# Patient Record
Sex: Female | Born: 1965 | Race: White | Hispanic: No | Marital: Single | Smoking: Never smoker
Health system: Southern US, Community
[De-identification: ages and names within clinical notes are randomized; demographics above are authoritative.]

## PROBLEM LIST (undated history)

## (undated) DIAGNOSIS — R5383 Other fatigue: Secondary | ICD-10-CM

## (undated) DIAGNOSIS — F32A Depression, unspecified: Secondary | ICD-10-CM

## (undated) DIAGNOSIS — F319 Bipolar disorder, unspecified: Secondary | ICD-10-CM

## (undated) DIAGNOSIS — K219 Gastro-esophageal reflux disease without esophagitis: Secondary | ICD-10-CM

## (undated) DIAGNOSIS — R519 Headache, unspecified: Secondary | ICD-10-CM

## (undated) DIAGNOSIS — I10 Essential (primary) hypertension: Secondary | ICD-10-CM

## (undated) DIAGNOSIS — Z808 Family history of malignant neoplasm of other organs or systems: Secondary | ICD-10-CM

## (undated) DIAGNOSIS — E039 Hypothyroidism, unspecified: Secondary | ICD-10-CM

## (undated) DIAGNOSIS — Z800 Family history of malignant neoplasm of digestive organs: Secondary | ICD-10-CM

## (undated) DIAGNOSIS — Z803 Family history of malignant neoplasm of breast: Secondary | ICD-10-CM

## (undated) DIAGNOSIS — C801 Malignant (primary) neoplasm, unspecified: Secondary | ICD-10-CM

## (undated) DIAGNOSIS — K589 Irritable bowel syndrome without diarrhea: Secondary | ICD-10-CM

## (undated) HISTORY — DX: Headache, unspecified: R51.9

## (undated) HISTORY — PX: OVARY SURGERY: SHX727

## (undated) HISTORY — PX: UPPER GI ENDOSCOPY: SHX6162

## (undated) HISTORY — DX: Family history of malignant neoplasm of digestive organs: Z80.0

## (undated) HISTORY — PX: COLONOSCOPY: SHX174

## (undated) HISTORY — DX: Bipolar disorder, unspecified: F31.9

## (undated) HISTORY — PX: CHOLECYSTECTOMY: SHX55

## (undated) HISTORY — DX: Irritable bowel syndrome, unspecified: K58.9

## (undated) HISTORY — DX: Other fatigue: R53.83

## (undated) HISTORY — DX: Hypothyroidism, unspecified: E03.9

## (undated) HISTORY — DX: Family history of malignant neoplasm of breast: Z80.3

## (undated) HISTORY — DX: Hypercalcemia: E83.52

## (undated) HISTORY — PX: REPLACEMENT TOTAL KNEE: SUR1224

## (undated) HISTORY — DX: Family history of malignant neoplasm of other organs or systems: Z80.8

---
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
## 2000-11-12 ENCOUNTER — Other Ambulatory Visit: Admission: RE | Admit: 2000-11-12 | Discharge: 2000-11-12 | Payer: Self-pay | Admitting: Family Medicine

## 2001-06-13 ENCOUNTER — Ambulatory Visit (HOSPITAL_COMMUNITY): Admission: RE | Admit: 2001-06-13 | Discharge: 2001-06-13 | Payer: Self-pay | Admitting: Obstetrics & Gynecology

## 2001-12-31 ENCOUNTER — Other Ambulatory Visit: Admission: RE | Admit: 2001-12-31 | Discharge: 2001-12-31 | Payer: Self-pay | Admitting: Family Medicine

## 2003-04-22 ENCOUNTER — Other Ambulatory Visit: Admission: RE | Admit: 2003-04-22 | Discharge: 2003-04-22 | Payer: Self-pay | Admitting: Family Medicine

## 2004-05-04 ENCOUNTER — Other Ambulatory Visit: Admission: RE | Admit: 2004-05-04 | Discharge: 2004-05-04 | Payer: Self-pay | Admitting: Family Medicine

## 2005-05-14 ENCOUNTER — Other Ambulatory Visit: Admission: RE | Admit: 2005-05-14 | Discharge: 2005-05-14 | Payer: Self-pay | Admitting: Family Medicine

## 2006-05-14 ENCOUNTER — Ambulatory Visit (HOSPITAL_COMMUNITY): Admission: RE | Admit: 2006-05-14 | Discharge: 2006-05-14 | Payer: Self-pay | Admitting: Family Medicine

## 2006-05-29 ENCOUNTER — Other Ambulatory Visit: Admission: RE | Admit: 2006-05-29 | Discharge: 2006-05-29 | Payer: Self-pay | Admitting: Family Medicine

## 2006-06-14 ENCOUNTER — Ambulatory Visit (HOSPITAL_COMMUNITY): Admission: RE | Admit: 2006-06-14 | Discharge: 2006-06-14 | Payer: Self-pay | Admitting: Gastroenterology

## 2006-06-17 ENCOUNTER — Ambulatory Visit (HOSPITAL_COMMUNITY): Admission: RE | Admit: 2006-06-17 | Discharge: 2006-06-17 | Payer: Self-pay | Admitting: Gastroenterology

## 2006-07-23 ENCOUNTER — Encounter (INDEPENDENT_AMBULATORY_CARE_PROVIDER_SITE_OTHER): Payer: Self-pay | Admitting: Specialist

## 2006-07-23 ENCOUNTER — Ambulatory Visit (HOSPITAL_COMMUNITY): Admission: RE | Admit: 2006-07-23 | Discharge: 2006-07-24 | Payer: Self-pay | Admitting: General Surgery

## 2007-12-01 ENCOUNTER — Other Ambulatory Visit: Admission: RE | Admit: 2007-12-01 | Discharge: 2007-12-01 | Payer: Self-pay | Admitting: Family Medicine

## 2008-12-01 ENCOUNTER — Other Ambulatory Visit: Admission: RE | Admit: 2008-12-01 | Discharge: 2008-12-01 | Payer: Self-pay | Admitting: Family Medicine

## 2010-07-25 NOTE — Op Note (Signed)
Anita Joyce, Anita NO.:  [id number]   MEDICAL RECORD NO.:  [id number]          PATIENT TYPE:  AMB   LOCATION:  DAY                          FACILITY:  [facility]   PHYSICIAN:  Adolph Pollack, M.D.DATE OF BIRTH:  06/26/1965   DATE OF PROCEDURE:  07/23/2006  DATE OF DISCHARGE:                               OPERATIVE REPORT   PREOPERATIVE DIAGNOSES:  Biliary dyskinesia.   POSTOPERATIVE DIAGNOSIS:  Biliary dyskinesia.   PROCEDURE:  Laparoscopic cholecystectomy and intraoperative  cholangiogram.   SURGEON:  Adolph Pollack, M.D.   ASSISTANT:  Ollen Gross. Vernell Morgans, M.D.   ANESTHESIA:  General.   INDICATION:  [name] is a 45 year old female who has been having  problems with right upper quadrant and epigastric pain with nausea and  vomiting.  It started around the Christmas of 2007.  It began became  worse and worse.  She had upper endoscopy which is unrevealing.  An  abdominal ultrasound showed a normal gallbladder.  Nuclear medicine  hepatobiliary scan demonstrates a dysfunctional gallbladder.  She has  tried proton pump inhibitors but still has symptoms.  She now presents  for elective laparoscopic cholecystectomy.   TECHNIQUE:  She was seen in the holding room and brought to the  operating room, placed supine on the operating table and general  anesthetic was administered.  The abdominal wall was sterilely prepped  and draped.  In the supraumbilical region local anesthetic was  infiltrated and an incision was made in the supraumbilical region  through the skin, subcutaneous tissue, fascia and peritoneum.  The  pursestring suture of 0-0 Vicryl placed around the fascial edges.  A  Hassan trocar was introduced into the peritoneal cavity and  pneumoperitoneum was then created by insufflation of CO2 gas.   She was then placed in reverse Trendelenburg position the right side  tilted up.  A laparoscope was introduced under direct vision an 11 mm  trocar  was placed an epigastric incision and two 5 mm trocars were  placed in the right mid abdomen.  The fundus of the gallbladder was  grasped.  Filmy adhesions between the omentum and duodenum, the  gallbladder, body the gallbladder were noted and these were carefully  taken down bluntly.  I then mobilized infundibulum staying with  dissection on the gallbladder.  I was able to isolate the cystic duct  and create a window around it.  A clip was then placed at the cystic  duct gallbladder junction.  A small incision was made in the cystic  duct.  A cholangiocatheter was passed through the anterior lateral  abdominal wall placed in the cystic duct and cholangiogram was  performed.   Under real time firm fluoroscopy dilute contrast material was injected  to the cystic duct which was of short length.  The common hepatic, right  and left hepatic, common bile ducts all filled and contrast drained  promptly into the duodenum.  There was no obvious evidence of  obstruction.  Final reports pending radiologist interpretation.   The cholangiocatheter was removed, the cystic duct was clipped three  times proximally and divided.  I identified the anterior posterior  branch of the cystic artery and created windows around these.  They were  clipped and divided.  I dissected the gallbladder free from liver using  electrocautery, placed in an Endopouch bag.  Some bile did spill out of  the bag.   I copiously irrigated gallbladder fossa and controlled bleeding with  electrocautery.  I then removed the gallbladder in the Endopouch bag  through the supraumbilical port.  I closed the fascial defect in the  supraumbilical region by tightening up and tying down the pursestring  suture under laparoscopic vision.  I then copiously irrigated out the  perihepatic area and noted fluid still appeared to be bilious.  I  subsequently cut the suture of the supraumbilical fascial closure and  reinserted Hasson on trocar.   I placed a 10 mm camera through the that  port and irrigated out the perihepatic area and inspected the  gallbladder fossa.  The clips on the cystic duct were solid.  There is  no evidence of bile leak from the gallbladder fossa.  I figured this was  just some of the spillage of bile from the bag.  The fluid cleared up  and was evacuated.   We then removed the trocars except for the epigastric trocar.  I then  closed supra-umbilical fascial defect with interrupted 0-0 Vicryl  sutures.  I reinsufflated the abdomen, reinspected the perihepatic area  and no bilious drainage or her leak was noted.  Fascial closure was  solid.  I then removed the trocar and released pneumoperitoneum.  The  skin incisions were closed with 4-0 Monocryl subcuticular stitches.  Steri-Strips and sterile dressings were applied.   She tolerated the procedure without any apparent complications and was  taken to recovery in satisfactory condition.      Adolph Pollack, M.D.  Electronically Signed     TJR/MEDQ  D:  07/23/2006  T:  07/23/2006  Job:  [id number]   cc:   Fayrene Fearing L. Malon Kindle., M.D.  Fax: [phone]   Dario Guardian, M.D.  Fax: [phone]

## 2010-07-28 NOTE — Op Note (Signed)
[facility]  Patient:    Anita Joyce, Anita Joyce [facility] Visit Number: [id number] MRN: [id number]          Service Type: DSU Location: [facility] Attending Physician:  Minette Headland Dictated by:   Freddy Finner, M.D. Proc. Date: 06/13/01 Admit Date:  06/13/2001                             Operative Report  PREOPERATIVE DIAGNOSIS:       Chronic right pelvic pain.  POSTOPERATIVE DIAGNOSIS:      Chronic right pelvic pain with no evidence of                               pelvic pathology except for slight irregular                               enlargement with mottled appearance of uterus                               (consistent with findings noted in 1993).  No                               evidence of pelvic or abdominal adhesions, no                               evidence of pelvic endometriosis.  OPERATION:                    1. Laparoscopy.                               2. Repeat uterosacral nerve ablation with                                  bipolar coagulation.  INTRAOPERATIVE COMPLICATIONS:  None.  SURGEON:                      Freddy Finner, M.D.  ESTIMATED BLOOD LOSS:         10 cc.  INDICATIONS:                  The patient has had chronic right pelvic and lower abdominal pain for a number of months.  She has been evaluated by gastroenterologist with negative findings, with normal CT scan of the abdomen and pelvis.  There was some suggestion that the uterus could be pressing on the bowel.  To her examination here in my office, there was slight enlargement of the uterus, but this was consistent with old findings and there was no significant change since 1993.  Based on the persistence of her symptoms and the negative CT and GI workup, she is now admitted for laparoscopy.  FINDINGS:                     As noted in the postoperative diagnosis.  DESCRIPTION OF PROCEDURE:     The patient was admitted on the morning of surgery, brought to the  operating room.  After placing adequate general endotracheal anesthesia, placed in the dorsal lithotomy position using the Allen stirrups.  Betadine prep of the abdomen, perineum, and vagina was carried out.  The bladder was evacuated with a Robinson catheter and sterile technique.  Hulka tenaculum was attached to the cervix, sterile drapes were applied.  Two small incisions were made, one through an old scar at the umbilicus and one just above the symphysis.  An 11 mm disposable trocar was introduced through the upper incision, and inspection revealed adequate placement within the peritoneal cavity with no evidence of injury on entry. Pneumoperitoneum was allowed to accumulate with carbon dioxide gas.  A second 5 mm trocar was placed in the lower incision.  A blunt probe was used for manipulation of pelvic contents during the procedure.  Systematic examination was carried out with findings as noted above.  The bipolar forceps were used through the operating channel of the laparoscope, and the uterosacrals were cauterized at their junction with the cervix without difficulty.  The procedure was terminated at this point, gas allowed to escape.  Instruments were removed.  The skin incisions were closed with interrupted subcuticular sutures of 3-0 Dexon.  Marcaine 0.25% plain was injected into the incision sites.  The patient was given 30 mg of Toradol IV.  She was taken to recovery in good condition, will be discharged with routine outpatient surgical instructions for follow-up in the office. Dictated by:   Freddy Finner, M.D. Attending Physician:  Minette Headland DD:  06/13/01 TD:  06/13/01 Job: [id number]

## 2010-07-28 NOTE — Op Note (Signed)
NAMEJANYAH, Anita Joyce             ACCOUNT NO.:  [id number]   MEDICAL RECORD NO.:  [id number]          PATIENT TYPE:  AMB   LOCATION:  ENDO                         FACILITY:  [facility]   PHYSICIAN:  James L. Malon Kindle., M.D.DATE OF BIRTH:  1965/08/10   DATE OF PROCEDURE:  06/14/2006  DATE OF DISCHARGE:                               OPERATIVE REPORT   PROCEDURE:  Esophagogastroduodenoscopy.   MEDICATIONS:  Cetacaine spray, Fentanyl 75 mcg, Versed 10 mg IV.   INDICATIONS:  Vague upper abdominal pain, nausea and vomiting, negative  gallbladder ultrasound.   DESCRIPTION OF PROCEDURE:  The procedure was explained to the patient  and consent was obtained. With the patient in the left lateral decubitus  position, the Pentax upper endoscope was inserted blindly into the  esophagus, advanced under direct visualization.  The stomach was  entered, pylorus identified and passed, the duodenum including the bulb  and second portion were seen well and were unremarkable.  The scope was  withdrawn back into the stomach.  The pyloric channel, antrum and body  were normal.  Fundus and cardia, sigmoid on retroflexed view were  normal.  Additionally, proximal esophagus was endoscopically normal.  The scope was withdrawn and the patient tolerated the procedure well.   ASSESSMENT:  Nausea, vomiting and abdominal pain with negative  endoscopy.   PLAN:  Will proceed with CCK stimulated hepatobiliary scan.  Will set up  that today.  Come back to the office in 3 weeks.           ______________________________  Llana Aliment Malon Kindle., M.D.     Waldron Session  D:  06/14/2006  T:  06/14/2006  Job:  [id number]

## 2012-02-18 ENCOUNTER — Other Ambulatory Visit: Payer: Self-pay | Admitting: Family Medicine

## 2012-02-18 ENCOUNTER — Other Ambulatory Visit (HOSPITAL_COMMUNITY)
Admission: RE | Admit: 2012-02-18 | Discharge: 2012-02-18 | Disposition: A | Discharge status: Home / Self Care | Payer: BC Managed Care – PPO | Source: Ambulatory Visit | Attending: Family Medicine | Admitting: Family Medicine

## 2012-02-18 DIAGNOSIS — Z0000 Encounter for general adult medical examination without abnormal findings: Secondary | ICD-10-CM | POA: Insufficient documentation

## 2013-05-19 ENCOUNTER — Other Ambulatory Visit: Payer: Self-pay | Admitting: Family Medicine

## 2013-05-19 ENCOUNTER — Ambulatory Visit
Admission: RE | Admit: 2013-05-19 | Discharge: 2013-05-19 | Disposition: A | Discharge status: Home / Self Care | Payer: BC Managed Care – PPO | Source: Ambulatory Visit | Attending: Family Medicine | Admitting: Family Medicine

## 2013-05-19 DIAGNOSIS — R109 Unspecified abdominal pain: Secondary | ICD-10-CM

## 2013-05-19 IMAGING — CT CT ABD-PELV W/ CM
2 of 5 series · 17 of 46 positions shown, 19 images · IV contrast (READICAT/WATER & [ID] OMNI 300)
Comparison: none

[Series 2: abd/pelvis with · axial · 0.84mm/px · z∈[-462,-42]mm · 14 of 93 slices shown, 16 images]
[im 6/93  soft-tissue]
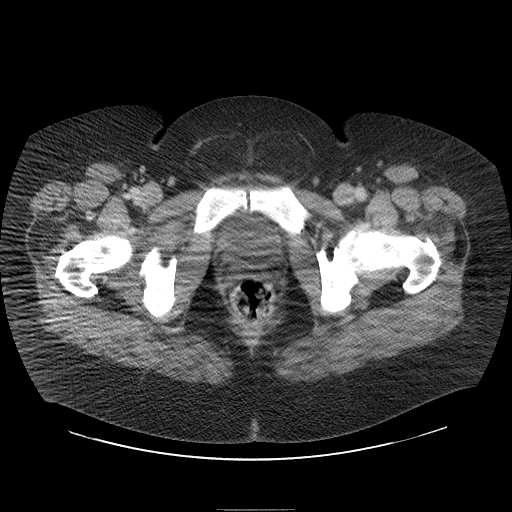
[im 6/93  bone]
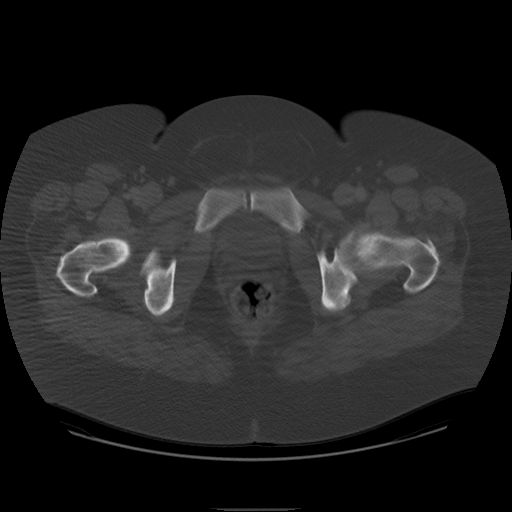
[im 11/93  soft-tissue]
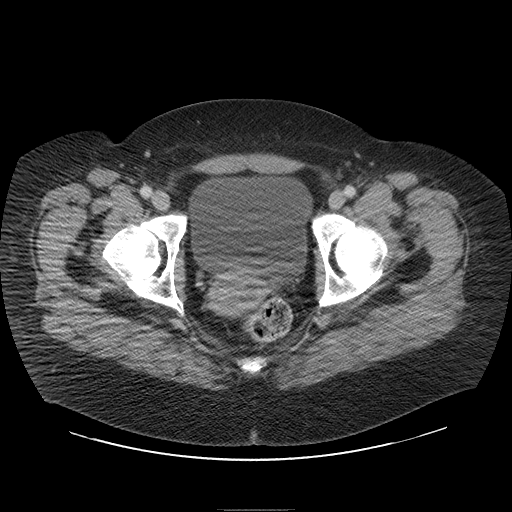
[im 17/93  soft-tissue]
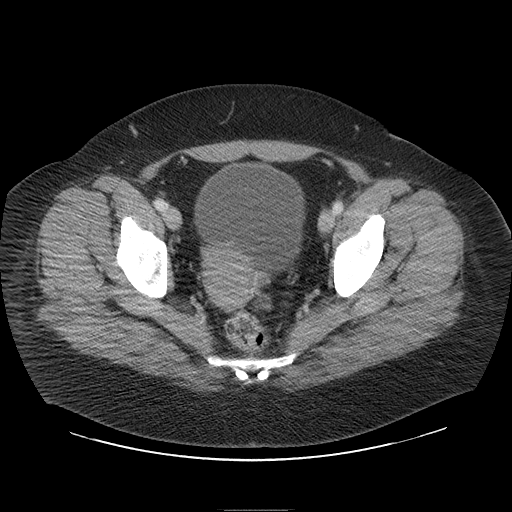
[im 28/93  soft-tissue]
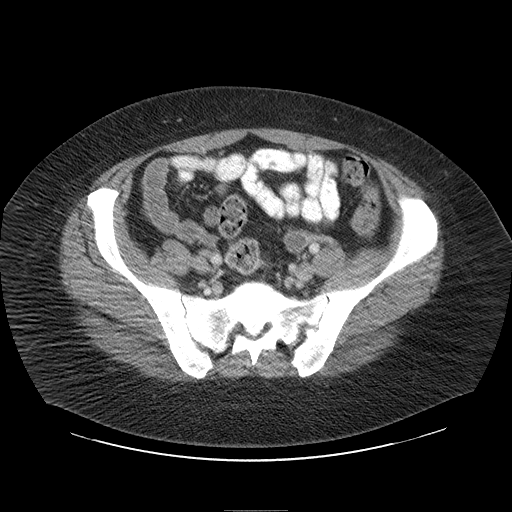
[im 33/93  soft-tissue]
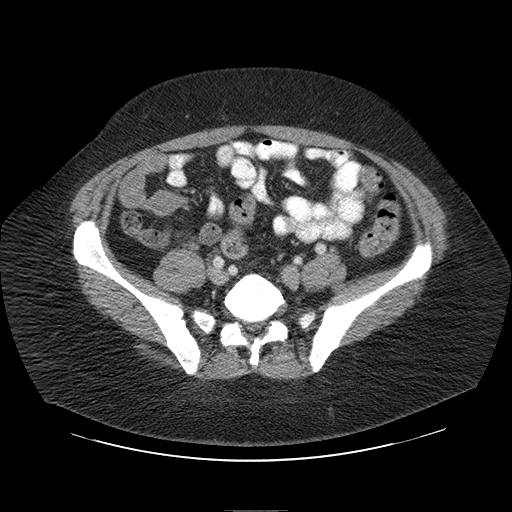
[im 38/93  soft-tissue]
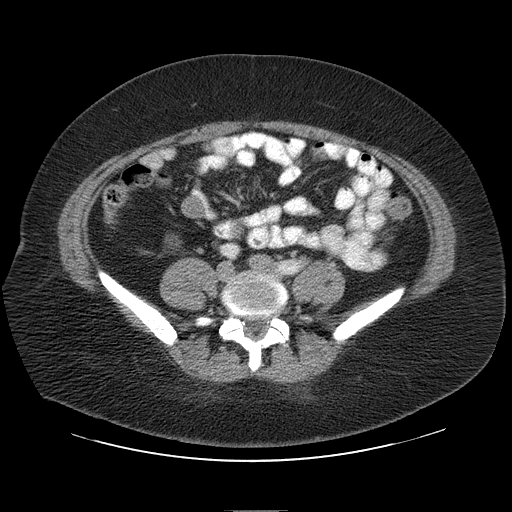
[im 44/93  soft-tissue]
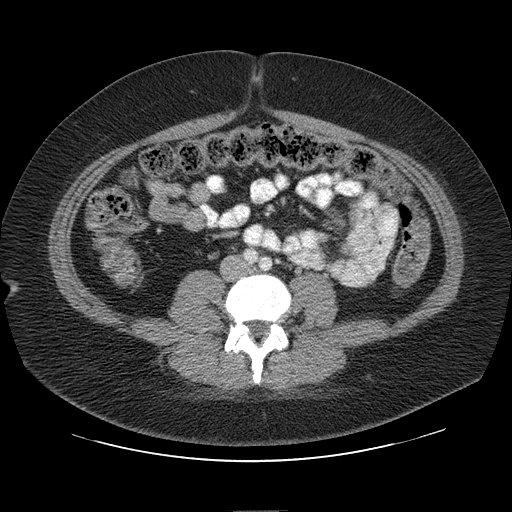
[im 49/93  soft-tissue]
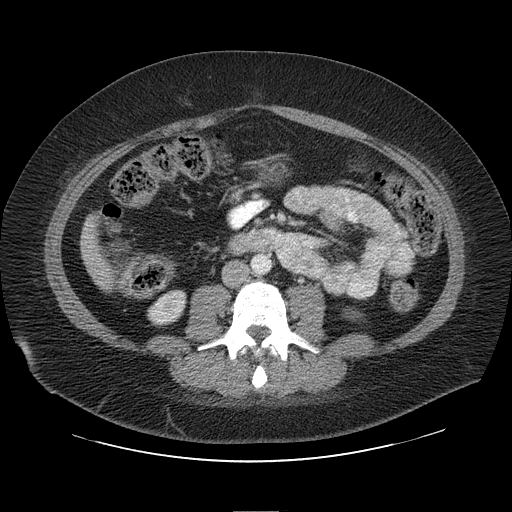
[im 55/93  soft-tissue]
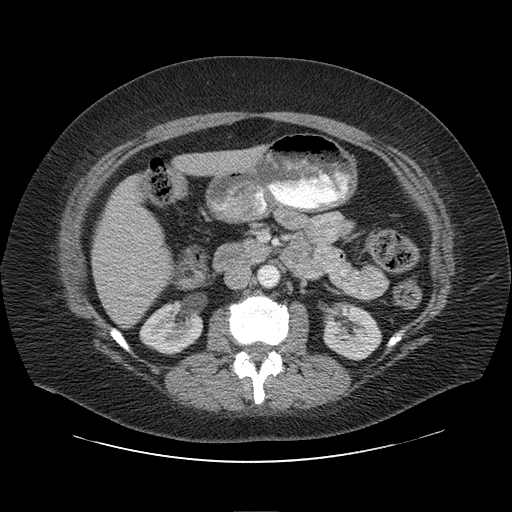
[im 55/93  bone]
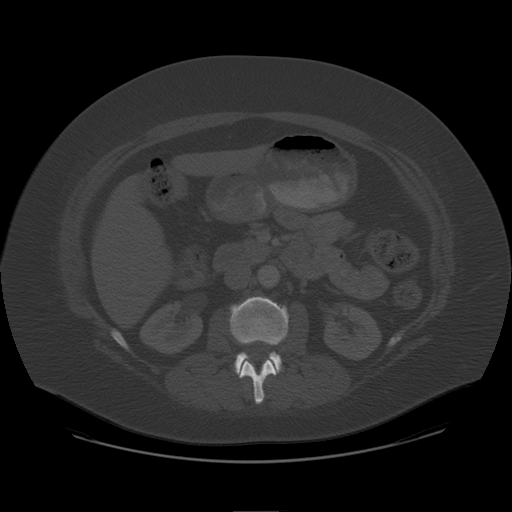
[im 60/93  soft-tissue]
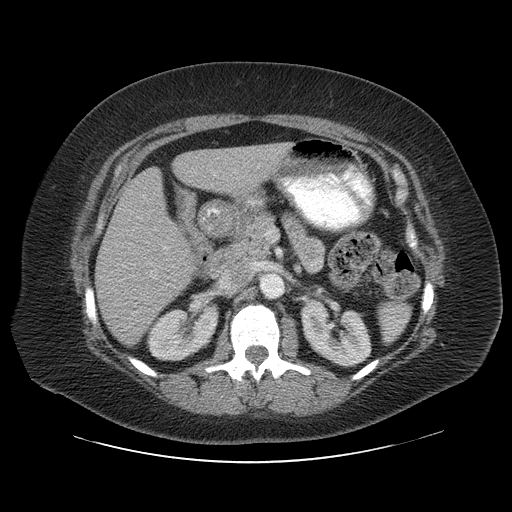
[im 71/93  soft-tissue]
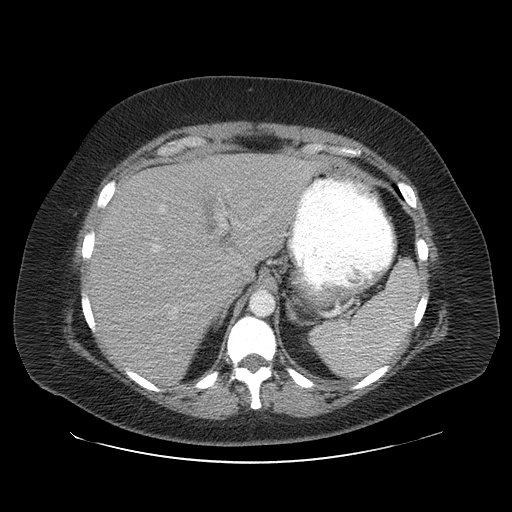
[im 76/93  soft-tissue]
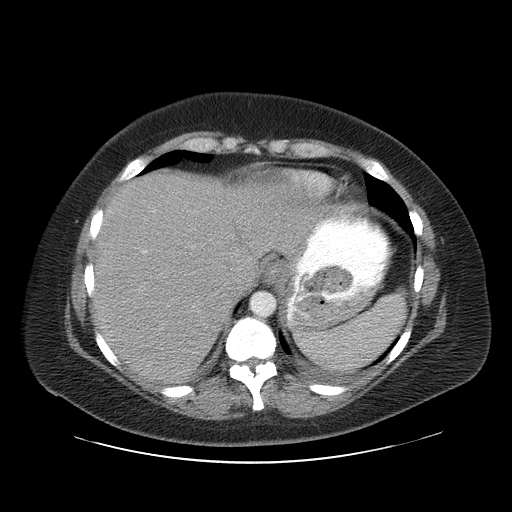
[im 82/93  soft-tissue]
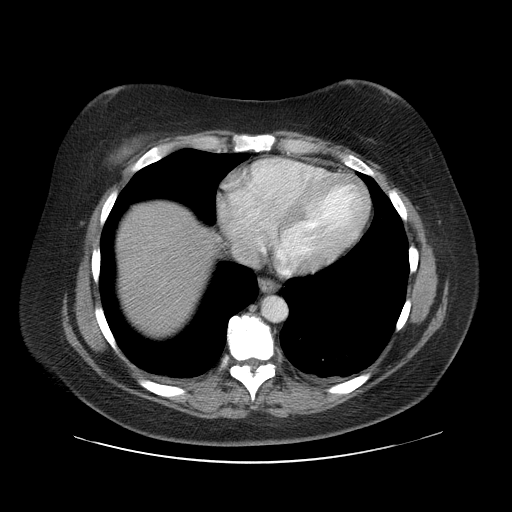
[im 87/93  soft-tissue]
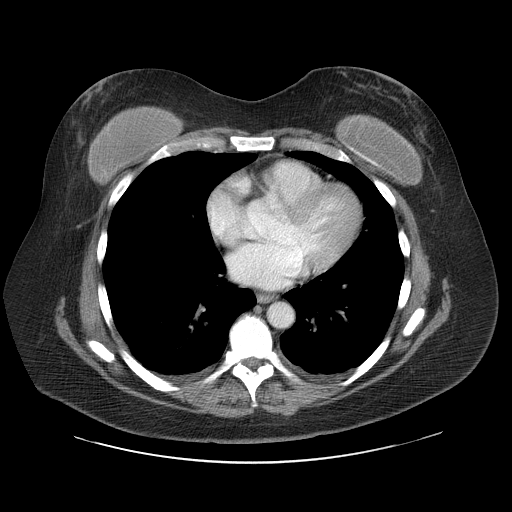

[Series 400: cor · coronal · 1.04mm/px · 3 of 137 slices shown]
[im 46/137  soft-tissue]
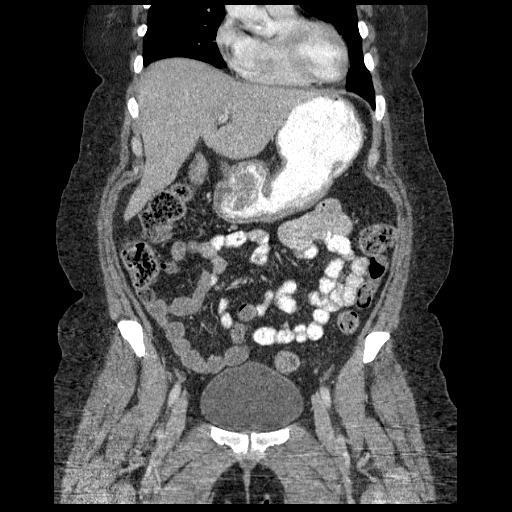
[im 61/137  soft-tissue]
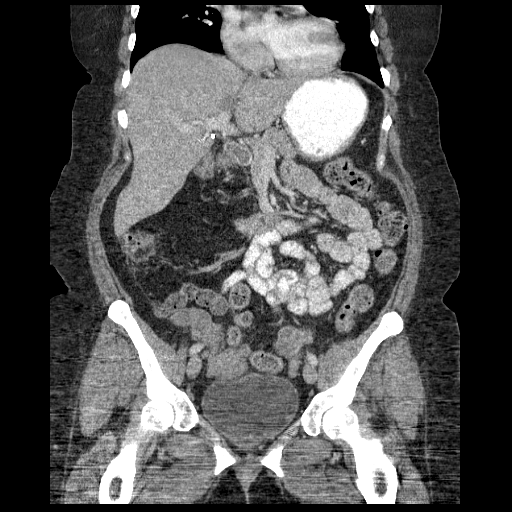
[im 76/137  soft-tissue]
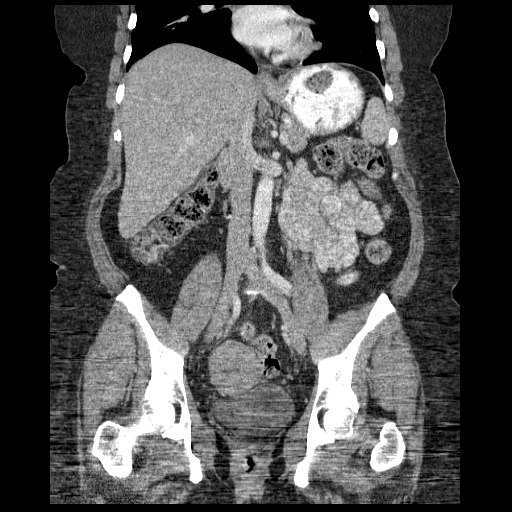

[17 of 46 positions shown; findings below may reference images not displayed]

CLINICAL DATA
New onset right abdomen pain

EXAM
CT ABDOMEN AND PELVIS WITH CONTRAST

TECHNIQUE
Multidetector CT imaging of the abdomen and pelvis was performed
using the standard protocol following bolus administration of
intravenous contrast.

CONTRAST
125 mL Omnipaque 300

COMPARISON
None.

FINDINGS
There is mild diffuse fatty infiltration liver. The patient is
status post prior cholecystectomy. The spleen, pancreas, adrenal
glands and kidneys are normal. There is right parapelvic renal cyst.
There is no hydronephrosis bilaterally. The aorta is normal. There
is no abdominal lymphadenopathy. There is no small bowel obstruction
or diverticulitis. Bowel content is noted throughout colon. The
appendix is normal.

Fluid-filled bladder is normal. The uterus is normal. The bilateral
ovaries are normal. There is minimal dependent atelectasis of the
posterior lung bases. Mild degenerative joint changes of the spine
are noted.

IMPRESSION
Normal appendix. No acute abnormality identified in the abdomen and
pelvis. Constipation.

SIGNATURE

## 2013-05-19 MED ORDER — IOHEXOL 300 MG/ML  SOLN
125.0000 mL | Freq: Once | INTRAMUSCULAR | Status: AC | PRN
Start: 2013-05-19 — End: 2013-05-19

## 2015-03-22 ENCOUNTER — Encounter: Payer: Self-pay | Admitting: Gastroenterology

## 2015-04-04 ENCOUNTER — Other Ambulatory Visit (HOSPITAL_COMMUNITY)
Admission: RE | Admit: 2015-04-04 | Discharge: 2015-04-04 | Disposition: A | Discharge status: Home / Self Care | Payer: Self-pay | Source: Ambulatory Visit | Attending: Family Medicine | Admitting: Family Medicine

## 2015-04-04 ENCOUNTER — Other Ambulatory Visit: Payer: Self-pay | Admitting: Family Medicine

## 2015-04-04 DIAGNOSIS — Z01419 Encounter for gynecological examination (general) (routine) without abnormal findings: Secondary | ICD-10-CM | POA: Insufficient documentation

## 2015-04-07 LAB — CYTOLOGY - PAP

## 2015-07-11 DIAGNOSIS — R197 Diarrhea, unspecified: Secondary | ICD-10-CM | POA: Diagnosis not present

## 2015-07-11 DIAGNOSIS — Z800 Family history of malignant neoplasm of digestive organs: Secondary | ICD-10-CM | POA: Diagnosis not present

## 2015-07-25 ENCOUNTER — Other Ambulatory Visit: Payer: Self-pay | Admitting: Gastroenterology

## 2015-07-25 DIAGNOSIS — R197 Diarrhea, unspecified: Secondary | ICD-10-CM | POA: Diagnosis not present

## 2015-07-25 DIAGNOSIS — K529 Noninfective gastroenteritis and colitis, unspecified: Secondary | ICD-10-CM | POA: Diagnosis not present

## 2015-07-25 DIAGNOSIS — Z800 Family history of malignant neoplasm of digestive organs: Secondary | ICD-10-CM | POA: Diagnosis not present

## 2015-11-07 DIAGNOSIS — E039 Hypothyroidism, unspecified: Secondary | ICD-10-CM | POA: Diagnosis not present

## 2016-01-13 DIAGNOSIS — L650 Telogen effluvium: Secondary | ICD-10-CM | POA: Diagnosis not present

## 2016-01-16 DIAGNOSIS — L659 Nonscarring hair loss, unspecified: Secondary | ICD-10-CM | POA: Diagnosis not present

## 2016-01-16 DIAGNOSIS — I10 Essential (primary) hypertension: Secondary | ICD-10-CM | POA: Diagnosis not present

## 2016-01-16 DIAGNOSIS — E039 Hypothyroidism, unspecified: Secondary | ICD-10-CM | POA: Diagnosis not present

## 2016-04-12 DIAGNOSIS — L650 Telogen effluvium: Secondary | ICD-10-CM | POA: Diagnosis not present

## 2016-04-12 DIAGNOSIS — L219 Seborrheic dermatitis, unspecified: Secondary | ICD-10-CM | POA: Diagnosis not present

## 2016-07-02 DIAGNOSIS — J209 Acute bronchitis, unspecified: Secondary | ICD-10-CM | POA: Diagnosis not present

## 2016-10-19 DIAGNOSIS — R1032 Left lower quadrant pain: Secondary | ICD-10-CM | POA: Diagnosis not present

## 2016-10-19 DIAGNOSIS — R300 Dysuria: Secondary | ICD-10-CM | POA: Diagnosis not present

## 2016-10-29 DIAGNOSIS — R1032 Left lower quadrant pain: Secondary | ICD-10-CM | POA: Diagnosis not present

## 2016-10-29 DIAGNOSIS — E039 Hypothyroidism, unspecified: Secondary | ICD-10-CM | POA: Diagnosis not present

## 2016-10-29 DIAGNOSIS — I10 Essential (primary) hypertension: Secondary | ICD-10-CM | POA: Diagnosis not present

## 2016-10-29 DIAGNOSIS — F3174 Bipolar disorder, in full remission, most recent episode manic: Secondary | ICD-10-CM | POA: Diagnosis not present

## 2016-11-09 DIAGNOSIS — R1030 Lower abdominal pain, unspecified: Secondary | ICD-10-CM | POA: Diagnosis not present

## 2016-11-09 DIAGNOSIS — N949 Unspecified condition associated with female genital organs and menstrual cycle: Secondary | ICD-10-CM | POA: Diagnosis not present

## 2016-11-09 DIAGNOSIS — N760 Acute vaginitis: Secondary | ICD-10-CM | POA: Diagnosis not present

## 2016-11-21 ENCOUNTER — Other Ambulatory Visit: Payer: Self-pay | Admitting: Physician Assistant

## 2016-11-21 DIAGNOSIS — R1032 Left lower quadrant pain: Secondary | ICD-10-CM

## 2016-11-22 ENCOUNTER — Ambulatory Visit
Admission: RE | Admit: 2016-11-22 | Discharge: 2016-11-22 | Disposition: A | Discharge status: Home / Self Care | Payer: BLUE CROSS/BLUE SHIELD | Source: Ambulatory Visit | Attending: Physician Assistant | Admitting: Physician Assistant

## 2016-11-22 DIAGNOSIS — R1032 Left lower quadrant pain: Secondary | ICD-10-CM

## 2016-11-22 DIAGNOSIS — K5732 Diverticulitis of large intestine without perforation or abscess without bleeding: Secondary | ICD-10-CM | POA: Diagnosis not present

## 2016-11-22 MED ORDER — IOPAMIDOL (ISOVUE-300) INJECTION 61%
125.0000 mL | Freq: Once | INTRAVENOUS | Status: AC | PRN
  Administered 2016-11-22: 125 mL via INTRAVENOUS

## 2016-11-23 ENCOUNTER — Other Ambulatory Visit: Payer: BLUE CROSS/BLUE SHIELD

## 2016-11-26 DIAGNOSIS — N3020 Other chronic cystitis without hematuria: Secondary | ICD-10-CM | POA: Diagnosis not present

## 2016-11-26 DIAGNOSIS — R350 Frequency of micturition: Secondary | ICD-10-CM | POA: Diagnosis not present

## 2016-11-26 DIAGNOSIS — R351 Nocturia: Secondary | ICD-10-CM | POA: Diagnosis not present

## 2016-12-10 DIAGNOSIS — R102 Pelvic and perineal pain: Secondary | ICD-10-CM | POA: Diagnosis not present

## 2016-12-14 DIAGNOSIS — R1032 Left lower quadrant pain: Secondary | ICD-10-CM | POA: Diagnosis not present

## 2016-12-14 DIAGNOSIS — K580 Irritable bowel syndrome with diarrhea: Secondary | ICD-10-CM | POA: Diagnosis not present

## 2017-01-07 DIAGNOSIS — E039 Hypothyroidism, unspecified: Secondary | ICD-10-CM | POA: Diagnosis not present

## 2017-01-14 DIAGNOSIS — L659 Nonscarring hair loss, unspecified: Secondary | ICD-10-CM | POA: Diagnosis not present

## 2017-01-14 DIAGNOSIS — E039 Hypothyroidism, unspecified: Secondary | ICD-10-CM | POA: Diagnosis not present

## 2017-01-14 DIAGNOSIS — J069 Acute upper respiratory infection, unspecified: Secondary | ICD-10-CM | POA: Diagnosis not present

## 2017-01-14 DIAGNOSIS — I10 Essential (primary) hypertension: Secondary | ICD-10-CM | POA: Diagnosis not present

## 2017-01-18 DIAGNOSIS — K580 Irritable bowel syndrome with diarrhea: Secondary | ICD-10-CM | POA: Diagnosis not present

## 2017-01-18 DIAGNOSIS — R1032 Left lower quadrant pain: Secondary | ICD-10-CM | POA: Diagnosis not present

## 2017-02-15 DIAGNOSIS — Z1231 Encounter for screening mammogram for malignant neoplasm of breast: Secondary | ICD-10-CM | POA: Diagnosis not present

## 2017-02-15 DIAGNOSIS — Z803 Family history of malignant neoplasm of breast: Secondary | ICD-10-CM | POA: Diagnosis not present

## 2017-04-26 DIAGNOSIS — K580 Irritable bowel syndrome with diarrhea: Secondary | ICD-10-CM | POA: Diagnosis not present

## 2017-04-26 DIAGNOSIS — R1032 Left lower quadrant pain: Secondary | ICD-10-CM | POA: Diagnosis not present

## 2017-05-29 DIAGNOSIS — M25562 Pain in left knee: Secondary | ICD-10-CM | POA: Diagnosis not present

## 2017-05-29 DIAGNOSIS — M25462 Effusion, left knee: Secondary | ICD-10-CM | POA: Diagnosis not present

## 2017-09-16 DIAGNOSIS — R300 Dysuria: Secondary | ICD-10-CM | POA: Diagnosis not present

## 2017-09-16 DIAGNOSIS — R1032 Left lower quadrant pain: Secondary | ICD-10-CM | POA: Diagnosis not present

## 2017-09-16 DIAGNOSIS — N390 Urinary tract infection, site not specified: Secondary | ICD-10-CM | POA: Diagnosis not present

## 2017-09-27 DIAGNOSIS — G43A0 Cyclical vomiting, not intractable: Secondary | ICD-10-CM | POA: Diagnosis not present

## 2017-09-27 DIAGNOSIS — K580 Irritable bowel syndrome with diarrhea: Secondary | ICD-10-CM | POA: Diagnosis not present

## 2017-09-27 DIAGNOSIS — R1030 Lower abdominal pain, unspecified: Secondary | ICD-10-CM | POA: Diagnosis not present

## 2017-09-27 DIAGNOSIS — R1084 Generalized abdominal pain: Secondary | ICD-10-CM | POA: Diagnosis not present

## 2017-10-25 DIAGNOSIS — R309 Painful micturition, unspecified: Secondary | ICD-10-CM | POA: Diagnosis not present

## 2017-10-25 DIAGNOSIS — M545 Low back pain: Secondary | ICD-10-CM | POA: Diagnosis not present

## 2017-10-25 DIAGNOSIS — R102 Pelvic and perineal pain: Secondary | ICD-10-CM | POA: Diagnosis not present

## 2017-10-30 DIAGNOSIS — K580 Irritable bowel syndrome with diarrhea: Secondary | ICD-10-CM | POA: Diagnosis not present

## 2017-11-12 DIAGNOSIS — E039 Hypothyroidism, unspecified: Secondary | ICD-10-CM | POA: Diagnosis not present

## 2018-01-13 DIAGNOSIS — E039 Hypothyroidism, unspecified: Secondary | ICD-10-CM | POA: Diagnosis not present

## 2018-01-13 DIAGNOSIS — I10 Essential (primary) hypertension: Secondary | ICD-10-CM | POA: Diagnosis not present

## 2018-01-13 DIAGNOSIS — L659 Nonscarring hair loss, unspecified: Secondary | ICD-10-CM | POA: Diagnosis not present

## 2018-01-13 DIAGNOSIS — E049 Nontoxic goiter, unspecified: Secondary | ICD-10-CM | POA: Diagnosis not present

## 2018-01-14 ENCOUNTER — Other Ambulatory Visit: Payer: Self-pay | Admitting: Endocrinology

## 2018-01-14 DIAGNOSIS — E010 Iodine-deficiency related diffuse (endemic) goiter: Secondary | ICD-10-CM

## 2018-01-17 ENCOUNTER — Ambulatory Visit
Admission: RE | Admit: 2018-01-17 | Discharge: 2018-01-17 | Disposition: A | Discharge status: Home / Self Care | Payer: BLUE CROSS/BLUE SHIELD | Source: Ambulatory Visit | Attending: Endocrinology | Admitting: Endocrinology

## 2018-01-17 DIAGNOSIS — E041 Nontoxic single thyroid nodule: Secondary | ICD-10-CM | POA: Diagnosis not present

## 2018-01-17 DIAGNOSIS — E010 Iodine-deficiency related diffuse (endemic) goiter: Secondary | ICD-10-CM

## 2018-01-27 DIAGNOSIS — F3174 Bipolar disorder, in full remission, most recent episode manic: Secondary | ICD-10-CM | POA: Diagnosis not present

## 2018-01-27 DIAGNOSIS — I10 Essential (primary) hypertension: Secondary | ICD-10-CM | POA: Diagnosis not present

## 2018-01-27 DIAGNOSIS — E039 Hypothyroidism, unspecified: Secondary | ICD-10-CM | POA: Diagnosis not present

## 2018-01-27 DIAGNOSIS — N949 Unspecified condition associated with female genital organs and menstrual cycle: Secondary | ICD-10-CM | POA: Diagnosis not present

## 2018-02-08 ENCOUNTER — Other Ambulatory Visit: Payer: Self-pay

## 2018-02-08 ENCOUNTER — Encounter (HOSPITAL_COMMUNITY): Payer: Self-pay

## 2018-02-08 ENCOUNTER — Emergency Department (HOSPITAL_COMMUNITY)
Admission: EM | Admit: 2018-02-08 | Discharge: 2018-02-08 | Disposition: A | Discharge status: Home / Self Care | Payer: BLUE CROSS/BLUE SHIELD | Attending: Emergency Medicine | Admitting: Emergency Medicine

## 2018-02-08 ENCOUNTER — Emergency Department (HOSPITAL_COMMUNITY): Payer: BLUE CROSS/BLUE SHIELD

## 2018-02-08 DIAGNOSIS — R413 Other amnesia: Secondary | ICD-10-CM | POA: Diagnosis not present

## 2018-02-08 DIAGNOSIS — R270 Ataxia, unspecified: Secondary | ICD-10-CM | POA: Diagnosis not present

## 2018-02-08 DIAGNOSIS — I10 Essential (primary) hypertension: Secondary | ICD-10-CM | POA: Insufficient documentation

## 2018-02-08 DIAGNOSIS — E039 Hypothyroidism, unspecified: Secondary | ICD-10-CM | POA: Diagnosis not present

## 2018-02-08 DIAGNOSIS — R202 Paresthesia of skin: Secondary | ICD-10-CM | POA: Insufficient documentation

## 2018-02-08 DIAGNOSIS — Z79899 Other long term (current) drug therapy: Secondary | ICD-10-CM | POA: Insufficient documentation

## 2018-02-08 DIAGNOSIS — R42 Dizziness and giddiness: Secondary | ICD-10-CM | POA: Diagnosis not present

## 2018-02-08 DIAGNOSIS — R251 Tremor, unspecified: Secondary | ICD-10-CM | POA: Diagnosis not present

## 2018-02-08 HISTORY — DX: Essential (primary) hypertension: I10

## 2018-02-08 LAB — COMPREHENSIVE METABOLIC PANEL
ALT: 21 U/L (ref 0–44)
AST: 27 U/L (ref 15–41)
Albumin: 4.1 g/dL (ref 3.5–5.0)
Alkaline Phosphatase: 97 U/L (ref 38–126)
Anion gap: 10 (ref 5–15)
BUN: 14 mg/dL (ref 6–20)
CO2: 22 mmol/L (ref 22–32)
Calcium: 10.2 mg/dL (ref 8.9–10.3)
Chloride: 107 mmol/L (ref 98–111)
Creatinine, Ser: 0.94 mg/dL (ref 0.44–1.00)
GFR calc Af Amer: >60 mL/min (ref >60)
GFR calc non Af Amer: >60 mL/min (ref >60)
Glucose, Bld: 85 mg/dL (ref 70–99)
Potassium: 3.8 mmol/L (ref 3.5–5.1)
Sodium: 139 mmol/L (ref 135–145)
Total Bilirubin: 0.6 mg/dL (ref 0.3–1.2)
Total Protein: 7.9 g/dL (ref 6.5–8.1)

## 2018-02-08 LAB — URINALYSIS, ROUTINE W REFLEX MICROSCOPIC
Bilirubin Urine: NEGATIVE
Glucose, UA: NEGATIVE mg/dL
Hgb urine dipstick: NEGATIVE
Ketones, ur: NEGATIVE mg/dL
Leukocytes, UA: NEGATIVE
Nitrite: NEGATIVE
Protein, ur: NEGATIVE mg/dL
Specific Gravity, Urine: 1.003 — ABNORMAL LOW (ref 1.005–1.030)
pH: 7 (ref 5.0–8.0)

## 2018-02-08 LAB — CBC WITH DIFFERENTIAL/PLATELET
Abs Immature Granulocytes: 0.01 10*3/uL (ref 0.00–0.07)
Basophils Absolute: 0 10*3/uL (ref 0.0–0.1)
Basophils Relative: 0 %
Eosinophils Absolute: 0.1 10*3/uL (ref 0.0–0.5)
Eosinophils Relative: 2 %
HCT: 46.7 % — ABNORMAL HIGH (ref 36.0–46.0)
Hemoglobin: 14.6 g/dL (ref 12.0–15.0)
Immature Granulocytes: 0 %
Lymphocytes Relative: 30 %
Lymphs Abs: 2.1 10*3/uL (ref 0.7–4.0)
MCH: 30.4 pg (ref 26.0–34.0)
MCHC: 31.3 g/dL (ref 30.0–36.0)
MCV: 97.1 fL (ref 80.0–100.0)
Monocytes Absolute: 0.4 10*3/uL (ref 0.1–1.0)
Monocytes Relative: 6 %
Neutro Abs: 4.2 10*3/uL (ref 1.7–7.7)
Neutrophils Relative %: 62 %
Platelets: 281 10*3/uL (ref 150–400)
RBC: 4.81 MIL/uL (ref 3.87–5.11)
RDW: 13.2 % (ref 11.5–15.5)
WBC: 6.9 10*3/uL (ref 4.0–10.5)
nRBC: 0 % (ref 0.0–0.2)

## 2018-02-08 LAB — I-STAT TROPONIN, ED: Troponin i, poc: 0 ng/mL (ref 0.00–0.08)

## 2018-02-08 LAB — LITHIUM LEVEL: Lithium Lvl: 1.05 mmol/L (ref 0.60–1.20)

## 2018-02-08 MED ORDER — SODIUM CHLORIDE 0.9 % IV BOLUS
500.0000 mL | Freq: Once | INTRAVENOUS | Status: AC
  Administered 2018-02-08: 500 mL via INTRAVENOUS

## 2018-02-08 MED ORDER — GADOBUTROL 1 MMOL/ML IV SOLN
8.0000 mL | Freq: Once | INTRAVENOUS | Status: AC | PRN
  Administered 2018-02-08: 8 mL via INTRAVENOUS

## 2018-02-08 MED ORDER — MECLIZINE HCL 25 MG PO TABS
25.0000 mg | ORAL_TABLET | Freq: Once | ORAL | Status: AC
  Administered 2018-02-08: 25 mg via ORAL
  Filled 2018-02-08: qty 1

## 2018-02-08 NOTE — ED Notes (Signed)
IV team unable to establish IV access. Stated they would return with [place] to try again.

## 2018-02-08 NOTE — ED Notes (Signed)
Patient transported to MRI 

## 2018-02-08 NOTE — ED Provider Notes (Signed)
[place] EMERGENCY DEPARTMENT Provider Note   CSN: [id number] Arrival date & time: 02/08/18  1323     History   Chief Complaint Chief Complaint  Patient presents with  . Numbness  . Dizziness    HPI Anita Joyce is a 52 y.o. female presenting for evaluation of dizziness and numbness.  Patient states for the past 4 days, she has been having intermittent episodes of dizziness.  She also reports bilateral lower extremity numbness, although the left foot numbness is intermittent.  Patient states has intermittent numbness of the left hand as well.  Patient states numbness is a painful tingling feeling.  Dizziness is described more as an imbalance as opposed to room spinning.  Dizziness worsens with movement of her head and change in position.  Today, while she was walking in a restaurant she went to turn and lost her balance and almost fell.  This prompted her visit to the ER.  She denies recent fevers, chills, nasal congestion, sore throat, cough, chest pain, shortness of breath, nausea, vomiting, abdominal pain, urinary symptoms, abnormal bowel movements.  She has a history of thyroidism for which she takes Synthroid, last TSH check was normal.  Patient has a history of bipolar for which she takes lithium, this level was checked last week and was normal.  She reports a family history of heart attacks and strokes in their mid 63s, but she has never had any such issues.  She does state over the past year, she has had increasing problems with her memory.  Denies tobacco, alcohol, or drug use.  Patient states the past several weeks, she has been taking B vitamins, and is concerned this could be the cause, she stopped 3 days ago.  HPI  Past Medical History:  Diagnosis Date  . Hypertension     There are no active problems to display for this patient.    OB History   None      Home Medications    Prior to Admission medications   Medication Sig Start Date End  Date Taking? Authorizing Provider  amLODipine (NORVASC) 10 MG tablet Take 10 mg by mouth at bedtime. 12/11/17  Yes [provider]  dicyclomine (BENTYL) 10 MG capsule Take 10 mg by mouth 2 (two) times daily. 01/03/18  Yes [provider]  FOLIC ACID PO Take 1 tablet by mouth at bedtime.   Yes [provider]  Lactobacillus (AZO COMPLETE FEMININE BALANCE) CAPS Take 1 capsule by mouth at bedtime.    Yes [provider]  levothyroxine (SYNTHROID, LEVOTHROID) 150 MCG tablet Take 150 mcg by mouth daily before breakfast.   Yes [provider]  lithium carbonate (LITHOBID) 300 MG CR tablet Take 300-1,200 mg by mouth See admin instructions. Take four tablets (1200 mg) by mouth every morning and one tablet (300 mg) at night 01/27/18  Yes [provider]  metoprolol succinate (TOPROL-XL) 25 MG 24 hr tablet Take 25 mg by mouth at bedtime.   Yes [provider]  Multiple Vitamin (MULTIVITAMIN WITH MINERALS) TABS tablet Take 1 tablet by mouth at bedtime.   Yes [provider]  Multiple Vitamins-Minerals (HAIR/SKIN/NAILS/BIOTIN) TABS Take 1 tablet by mouth at bedtime.   Yes [provider]  omeprazole (PRILOSEC OTC) 20 MG tablet Take 20 mg by mouth See admin instructions. Take one tablet (20 mg) by mouth every other night   Yes [provider]  b complex vitamins tablet Take 1 tablet by mouth at bedtime.  [provider]    Family History No family history on file.  Social History Social History   Tobacco Use  . Smoking status: Never Smoker  . Smokeless tobacco: Never Used  Substance Use Topics  . Alcohol use: Yes  . Drug use: Never     Allergies   Patient has no known allergies.   Review of Systems Review of Systems  Neurological: Positive for dizziness and numbness (Paresthesias).  All other systems reviewed and are negative.    Physical Exam Updated Vital Signs BP 108/85 (BP Location:  Left Arm)   Pulse 69   Temp 97.9 F (36.6 C) (Oral)   Resp 18   Ht 5\' 9"  (1.753 m)   Wt 107 kg   SpO2 99%   BMI 34.85 kg/m   Physical Exam  Constitutional: She is oriented to person, place, and time. She appears well-developed and well-nourished. No distress.  Sitting comfortably in the bed in no acute distress  HENT:  Head: Normocephalic and atraumatic.  Eyes: Pupils are equal, round, and reactive to light. Conjunctivae and EOM are normal.  Neck: Normal range of motion. Neck supple.  Cardiovascular: Normal rate, regular rhythm and intact distal pulses.  Pulmonary/Chest: Effort normal and breath sounds normal. No respiratory distress. She has no wheezes.  Abdominal: Soft. She exhibits no distension and no mass. There is no tenderness. There is no rebound and no guarding.  Musculoskeletal: Normal range of motion. She exhibits no tenderness.  Strength intact x4.  Sensation intact x4.  Radial pedal pulses intact bilaterally.  Soft compartments.  No leg pain or swelling.  Neurological: She is alert and oriented to person, place, and time. No sensory deficit.  No obvious neurologic deficits.  CN intact.  Nose to finger intact.  Fine movement and coordination intact.  Grip strength intact.  Patellar reflexes equal bilaterally.  Skin: Skin is warm and dry. Capillary refill takes less than 2 seconds.  Psychiatric: She has a normal mood and affect.  Nursing note and vitals reviewed.    ED Treatments / Results  Labs (all labs ordered are listed, but only abnormal results are displayed) Labs Reviewed  CBC WITH DIFFERENTIAL/PLATELET - Abnormal; Notable for the following components:      Result Value   HCT 46.7 (*)    All other components within normal limits  URINALYSIS, ROUTINE W REFLEX MICROSCOPIC - Abnormal; Notable for the following components:   Color, Urine COLORLESS (*)    Specific Gravity, Urine 1.003 (*)    All other components within normal limits  COMPREHENSIVE METABOLIC  PANEL  LITHIUM LEVEL  I-STAT TROPONIN, ED    EKG EKG Interpretation  Date/Time:  Saturday February 08 2018 14:20:35 EST Ventricular Rate:  74 PR Interval:    QRS Duration: 90 QT Interval:  397 QTC Calculation: 441 R Axis:   30 Text Interpretation:  Sinus rhythm Low voltage, precordial leads Since last tracing of earlier today No significant change was found Confirmed by Daleen Bo [phone]) on 02/08/2018 2:49:56 PM   Radiology Mr Brain W And Wo Contrast  Result Date: 02/08/2018 CLINICAL DATA:  Ataxia.  Rule out stroke. EXAM: MRI HEAD WITHOUT AND WITH CONTRAST TECHNIQUE: Multiplanar, multiecho pulse sequences of the brain and surrounding structures were obtained without and with intravenous contrast. CONTRAST:  8 mL Gadovist IV COMPARISON:  None. FINDINGS: Brain: No acute infarction, hemorrhage, hydrocephalus, extra-axial collection or mass lesion. Normal enhancement postcontrast infusion Vascular: Normal arterial flow voids Skull and upper cervical spine: Negative  Sinuses/Orbits: Mild mucosal edema paranasal sinuses.  Normal orbit Other: None IMPRESSION: Normal MRI of the brain with contrast. Electronically Signed   By: Franchot Gallo M.D.   On: 02/08/2018 18:07    Procedures Procedures (including critical care time)  Medications Ordered in ED Medications  sodium chloride 0.9 % bolus 500 mL (0 mLs Intravenous Stopped 02/08/18 1800)  meclizine (ANTIVERT) tablet 25 mg (25 mg Oral Given 02/08/18 1608)  gadobutrol (GADAVIST) 1 MMOL/ML injection 8 mL (8 mLs Intravenous Contrast Given 02/08/18 1744)     Initial Impression / Assessment and Plan / ED Course  I have reviewed the triage vital signs and the nursing notes.  Pertinent labs & imaging results that were available during my care of the patient were reviewed by me and considered in my medical decision making (see chart for details).     Patient presenting for evaluation of paresthesias and dizziness.  Physical exam  reassuring, no obvious neurologic deficit.  However, considering history with associated year-long memory loss, will obtain MRI for further evaluation.  Additionally, will obtain labs, urine, lithium level, troponin.  EKG ordered. Meclizine and fluids given for sx control.   EKG without STEMI.  Troponin negative.  Lithium level reassuring.  CBC without leukocytosis.  Electrolytes stable.  Hemoglobin stable.  Urine without infection.  On reassessment, patient reports no significant change in her dizziness with meclizine and fluids.  MRI pending.  MRI reassuring, no sign of abnormal lesions, stroke, or bleeding.  Discussed findings with patient.  Discussed that at this time I do not know the cause of her symptoms, but there does not appear to be an emergent, life-threatening, or surgical cause.  I do not believe she needs hospitalization at this time.  Discussed with patient that she is to continue to hold off on the B vitamins, and follow-up with her PCP for further evaluation on Monday.  Discussed possible need for neurology outpatient follow-up.  At this time, patient appears safe for discharge.  Return precautions given.  Patient states she understands and agrees to plan.  Final Clinical Impressions(s) / ED Diagnoses   Final diagnoses:  Paresthesia  Dizziness    ED Discharge Orders    None       Franchot Heidelberg, PA-C 02/08/18 2237    Deno Etienne, DO 02/08/18 2320

## 2018-02-08 NOTE — ED Notes (Signed)
States onset one year has intermittent forgetfulness and left finger tremors for years.

## 2018-02-08 NOTE — Discharge Instructions (Addendum)
Report that you follow-up with your primary care doctor for further evaluation of your symptoms. There is information for two neurology practices listed below, if you would like further evaluation by neurology. Make sure you to need to stay well-hydrated. Return to the emergency room with any new, worsening, concerning symptoms.

## 2018-02-08 NOTE — ED Triage Notes (Signed)
Pt reports numbness in her right leg and a little in her left leg for the past 4 days along with dizziness and balance issues.

## 2018-02-13 DIAGNOSIS — I959 Hypotension, unspecified: Secondary | ICD-10-CM | POA: Diagnosis not present

## 2018-02-13 DIAGNOSIS — R200 Anesthesia of skin: Secondary | ICD-10-CM | POA: Diagnosis not present

## 2018-09-29 DIAGNOSIS — K219 Gastro-esophageal reflux disease without esophagitis: Secondary | ICD-10-CM | POA: Diagnosis not present

## 2018-09-29 DIAGNOSIS — F3181 Bipolar II disorder: Secondary | ICD-10-CM | POA: Diagnosis not present

## 2018-09-29 DIAGNOSIS — I10 Essential (primary) hypertension: Secondary | ICD-10-CM | POA: Diagnosis not present

## 2018-09-29 DIAGNOSIS — E039 Hypothyroidism, unspecified: Secondary | ICD-10-CM | POA: Diagnosis not present

## 2019-01-07 DIAGNOSIS — K580 Irritable bowel syndrome with diarrhea: Secondary | ICD-10-CM | POA: Diagnosis not present

## 2019-01-16 DIAGNOSIS — Z803 Family history of malignant neoplasm of breast: Secondary | ICD-10-CM | POA: Diagnosis not present

## 2019-01-16 DIAGNOSIS — Z1231 Encounter for screening mammogram for malignant neoplasm of breast: Secondary | ICD-10-CM | POA: Diagnosis not present

## 2019-01-19 DIAGNOSIS — I10 Essential (primary) hypertension: Secondary | ICD-10-CM | POA: Diagnosis not present

## 2019-01-19 DIAGNOSIS — L659 Nonscarring hair loss, unspecified: Secondary | ICD-10-CM | POA: Diagnosis not present

## 2019-01-19 DIAGNOSIS — E039 Hypothyroidism, unspecified: Secondary | ICD-10-CM | POA: Diagnosis not present

## 2019-01-19 DIAGNOSIS — E049 Nontoxic goiter, unspecified: Secondary | ICD-10-CM | POA: Diagnosis not present

## 2019-01-22 DIAGNOSIS — E039 Hypothyroidism, unspecified: Secondary | ICD-10-CM | POA: Diagnosis not present

## 2019-01-22 DIAGNOSIS — I10 Essential (primary) hypertension: Secondary | ICD-10-CM | POA: Diagnosis not present

## 2019-01-22 DIAGNOSIS — F3181 Bipolar II disorder: Secondary | ICD-10-CM | POA: Diagnosis not present

## 2019-03-18 DIAGNOSIS — M25562 Pain in left knee: Secondary | ICD-10-CM | POA: Diagnosis not present

## 2019-03-18 DIAGNOSIS — M25462 Effusion, left knee: Secondary | ICD-10-CM | POA: Diagnosis not present

## 2019-03-18 DIAGNOSIS — M1712 Unilateral primary osteoarthritis, left knee: Secondary | ICD-10-CM | POA: Diagnosis not present

## 2019-03-23 DIAGNOSIS — M1712 Unilateral primary osteoarthritis, left knee: Secondary | ICD-10-CM | POA: Diagnosis not present

## 2019-03-23 DIAGNOSIS — M6281 Muscle weakness (generalized): Secondary | ICD-10-CM | POA: Diagnosis not present

## 2019-07-23 DIAGNOSIS — M25562 Pain in left knee: Secondary | ICD-10-CM | POA: Diagnosis not present

## 2019-07-23 DIAGNOSIS — M1712 Unilateral primary osteoarthritis, left knee: Secondary | ICD-10-CM | POA: Diagnosis not present

## 2019-07-31 DIAGNOSIS — E039 Hypothyroidism, unspecified: Secondary | ICD-10-CM | POA: Diagnosis not present

## 2019-08-22 DIAGNOSIS — Z6835 Body mass index (BMI) 35.0-35.9, adult: Secondary | ICD-10-CM | POA: Diagnosis not present

## 2019-08-22 DIAGNOSIS — M1712 Unilateral primary osteoarthritis, left knee: Secondary | ICD-10-CM | POA: Diagnosis not present

## 2019-09-10 DIAGNOSIS — Z01818 Encounter for other preprocedural examination: Secondary | ICD-10-CM | POA: Diagnosis not present

## 2019-09-10 DIAGNOSIS — E039 Hypothyroidism, unspecified: Secondary | ICD-10-CM | POA: Diagnosis not present

## 2019-09-10 DIAGNOSIS — K219 Gastro-esophageal reflux disease without esophagitis: Secondary | ICD-10-CM | POA: Diagnosis not present

## 2019-09-10 DIAGNOSIS — I10 Essential (primary) hypertension: Secondary | ICD-10-CM | POA: Diagnosis not present

## 2019-09-10 DIAGNOSIS — F3174 Bipolar disorder, in full remission, most recent episode manic: Secondary | ICD-10-CM | POA: Diagnosis not present

## 2019-09-11 DIAGNOSIS — Z01818 Encounter for other preprocedural examination: Secondary | ICD-10-CM | POA: Diagnosis not present

## 2019-09-15 DIAGNOSIS — M1712 Unilateral primary osteoarthritis, left knee: Secondary | ICD-10-CM | POA: Diagnosis not present

## 2019-09-25 DIAGNOSIS — Z96652 Presence of left artificial knee joint: Secondary | ICD-10-CM | POA: Diagnosis not present

## 2019-09-25 DIAGNOSIS — G8918 Other acute postprocedural pain: Secondary | ICD-10-CM | POA: Diagnosis not present

## 2019-09-25 DIAGNOSIS — M1712 Unilateral primary osteoarthritis, left knee: Secondary | ICD-10-CM | POA: Diagnosis not present

## 2020-01-11 DIAGNOSIS — E039 Hypothyroidism, unspecified: Secondary | ICD-10-CM | POA: Diagnosis not present

## 2020-01-11 DIAGNOSIS — E049 Nontoxic goiter, unspecified: Secondary | ICD-10-CM | POA: Diagnosis not present

## 2020-01-11 DIAGNOSIS — I10 Essential (primary) hypertension: Secondary | ICD-10-CM | POA: Diagnosis not present

## 2020-01-18 DIAGNOSIS — Z23 Encounter for immunization: Secondary | ICD-10-CM | POA: Diagnosis not present

## 2020-01-18 DIAGNOSIS — I10 Essential (primary) hypertension: Secondary | ICD-10-CM | POA: Diagnosis not present

## 2020-01-18 DIAGNOSIS — E049 Nontoxic goiter, unspecified: Secondary | ICD-10-CM | POA: Diagnosis not present

## 2020-01-18 DIAGNOSIS — E039 Hypothyroidism, unspecified: Secondary | ICD-10-CM | POA: Diagnosis not present

## 2020-02-02 DIAGNOSIS — Z96652 Presence of left artificial knee joint: Secondary | ICD-10-CM | POA: Diagnosis not present

## 2020-02-02 DIAGNOSIS — Z471 Aftercare following joint replacement surgery: Secondary | ICD-10-CM | POA: Diagnosis not present

## 2020-02-19 DIAGNOSIS — Z1231 Encounter for screening mammogram for malignant neoplasm of breast: Secondary | ICD-10-CM | POA: Diagnosis not present

## 2020-02-25 DIAGNOSIS — E210 Primary hyperparathyroidism: Secondary | ICD-10-CM | POA: Diagnosis not present

## 2020-02-25 DIAGNOSIS — E049 Nontoxic goiter, unspecified: Secondary | ICD-10-CM | POA: Diagnosis not present

## 2020-02-25 DIAGNOSIS — I10 Essential (primary) hypertension: Secondary | ICD-10-CM | POA: Diagnosis not present

## 2020-02-25 DIAGNOSIS — E039 Hypothyroidism, unspecified: Secondary | ICD-10-CM | POA: Diagnosis not present

## 2020-02-29 DIAGNOSIS — Z803 Family history of malignant neoplasm of breast: Secondary | ICD-10-CM | POA: Diagnosis not present

## 2020-02-29 DIAGNOSIS — R921 Mammographic calcification found on diagnostic imaging of breast: Secondary | ICD-10-CM | POA: Diagnosis not present

## 2020-03-03 DIAGNOSIS — E210 Primary hyperparathyroidism: Secondary | ICD-10-CM | POA: Diagnosis not present

## 2020-03-16 ENCOUNTER — Other Ambulatory Visit: Payer: Self-pay | Admitting: Radiology

## 2020-03-16 DIAGNOSIS — D0511 Intraductal carcinoma in situ of right breast: Secondary | ICD-10-CM | POA: Diagnosis not present

## 2020-03-16 DIAGNOSIS — R921 Mammographic calcification found on diagnostic imaging of breast: Secondary | ICD-10-CM | POA: Diagnosis not present

## 2020-03-18 DIAGNOSIS — C50919 Malignant neoplasm of unspecified site of unspecified female breast: Secondary | ICD-10-CM | POA: Diagnosis not present

## 2020-03-18 DIAGNOSIS — Z800 Family history of malignant neoplasm of digestive organs: Secondary | ICD-10-CM | POA: Diagnosis not present

## 2020-03-21 ENCOUNTER — Telehealth: Payer: Self-pay | Admitting: Hematology

## 2020-03-21 NOTE — Telephone Encounter (Signed)
Left message for patient to return call in reference to [facility] for morning or afternoon clinic

## 2020-03-22 DIAGNOSIS — Z01812 Encounter for preprocedural laboratory examination: Secondary | ICD-10-CM | POA: Diagnosis not present

## 2020-03-23 ENCOUNTER — Encounter: Payer: Self-pay | Admitting: *Deleted

## 2020-03-23 DIAGNOSIS — D0511 Intraductal carcinoma in situ of right breast: Secondary | ICD-10-CM

## 2020-03-25 ENCOUNTER — Encounter: Payer: Self-pay | Admitting: Family Medicine

## 2020-03-25 DIAGNOSIS — Z1211 Encounter for screening for malignant neoplasm of colon: Secondary | ICD-10-CM | POA: Diagnosis not present

## 2020-03-25 DIAGNOSIS — D120 Benign neoplasm of cecum: Secondary | ICD-10-CM | POA: Diagnosis not present

## 2020-03-25 DIAGNOSIS — K6389 Other specified diseases of intestine: Secondary | ICD-10-CM | POA: Diagnosis not present

## 2020-03-25 DIAGNOSIS — Z800 Family history of malignant neoplasm of digestive organs: Secondary | ICD-10-CM | POA: Diagnosis not present

## 2020-03-25 DIAGNOSIS — K5289 Other specified noninfective gastroenteritis and colitis: Secondary | ICD-10-CM | POA: Diagnosis not present

## 2020-03-30 ENCOUNTER — Other Ambulatory Visit: Payer: Self-pay

## 2020-03-30 ENCOUNTER — Other Ambulatory Visit: Payer: Self-pay | Admitting: General Surgery

## 2020-03-30 ENCOUNTER — Encounter: Payer: Self-pay | Admitting: Hematology

## 2020-03-30 ENCOUNTER — Encounter: Payer: Self-pay | Admitting: *Deleted

## 2020-03-30 ENCOUNTER — Inpatient Hospital Stay: Payer: BC Managed Care – PPO | Attending: Hematology | Admitting: Hematology

## 2020-03-30 ENCOUNTER — Inpatient Hospital Stay: Payer: BC Managed Care – PPO

## 2020-03-30 ENCOUNTER — Encounter: Payer: Self-pay | Admitting: Radiation Oncology

## 2020-03-30 ENCOUNTER — Encounter: Payer: Self-pay | Admitting: General Practice

## 2020-03-30 ENCOUNTER — Ambulatory Visit (HOSPITAL_BASED_OUTPATIENT_CLINIC_OR_DEPARTMENT_OTHER): Payer: BC Managed Care – PPO | Admitting: Genetic Counselor

## 2020-03-30 ENCOUNTER — Ambulatory Visit
Admission: RE | Admit: 2020-03-30 | Discharge: 2020-03-30 | Disposition: A | Discharge status: Home / Self Care | Payer: BC Managed Care – PPO | Source: Ambulatory Visit | Attending: Radiation Oncology | Admitting: Radiation Oncology

## 2020-03-30 ENCOUNTER — Ambulatory Visit: Payer: BC Managed Care – PPO | Admitting: Physical Therapy

## 2020-03-30 VITALS — BP 119/85 | HR 93 | Temp 97.9°F | Resp 20 | Ht 69.0 in | Wt 273.5 lb

## 2020-03-30 DIAGNOSIS — C50411 Malignant neoplasm of upper-outer quadrant of right female breast: Secondary | ICD-10-CM | POA: Diagnosis not present

## 2020-03-30 DIAGNOSIS — D0511 Intraductal carcinoma in situ of right breast: Secondary | ICD-10-CM

## 2020-03-30 DIAGNOSIS — F319 Bipolar disorder, unspecified: Secondary | ICD-10-CM | POA: Diagnosis not present

## 2020-03-30 DIAGNOSIS — Z808 Family history of malignant neoplasm of other organs or systems: Secondary | ICD-10-CM

## 2020-03-30 DIAGNOSIS — K589 Irritable bowel syndrome without diarrhea: Secondary | ICD-10-CM | POA: Diagnosis not present

## 2020-03-30 DIAGNOSIS — Z79899 Other long term (current) drug therapy: Secondary | ICD-10-CM | POA: Diagnosis not present

## 2020-03-30 DIAGNOSIS — Z9882 Breast implant status: Secondary | ICD-10-CM | POA: Diagnosis not present

## 2020-03-30 DIAGNOSIS — Z803 Family history of malignant neoplasm of breast: Secondary | ICD-10-CM | POA: Insufficient documentation

## 2020-03-30 DIAGNOSIS — E039 Hypothyroidism, unspecified: Secondary | ICD-10-CM | POA: Insufficient documentation

## 2020-03-30 DIAGNOSIS — Z170 Estrogen receptor positive status [ER+]: Secondary | ICD-10-CM

## 2020-03-30 DIAGNOSIS — Z800 Family history of malignant neoplasm of digestive organs: Secondary | ICD-10-CM

## 2020-03-30 DIAGNOSIS — I10 Essential (primary) hypertension: Secondary | ICD-10-CM | POA: Insufficient documentation

## 2020-03-30 LAB — CBC WITH DIFFERENTIAL (CANCER CENTER ONLY)
Abs Immature Granulocytes: 0.01 10*3/uL (ref 0.00–0.07)
Basophils Absolute: 0 10*3/uL (ref 0.0–0.1)
Basophils Relative: 0 %
Eosinophils Absolute: 0.2 10*3/uL (ref 0.0–0.5)
Eosinophils Relative: 2 %
HCT: 40.8 % (ref 36.0–46.0)
Hemoglobin: 13.2 g/dL (ref 12.0–15.0)
Immature Granulocytes: 0 %
Lymphocytes Relative: 25 %
Lymphs Abs: 2 10*3/uL (ref 0.7–4.0)
MCH: 30.8 pg (ref 26.0–34.0)
MCHC: 32.4 g/dL (ref 30.0–36.0)
MCV: 95.3 fL (ref 80.0–100.0)
Monocytes Absolute: 0.5 10*3/uL (ref 0.1–1.0)
Monocytes Relative: 6 %
Neutro Abs: 5.3 10*3/uL (ref 1.7–7.7)
Neutrophils Relative %: 67 %
Platelet Count: 248 10*3/uL (ref 150–400)
RBC: 4.28 MIL/uL (ref 3.87–5.11)
RDW: 13.2 % (ref 11.5–15.5)
WBC Count: 8 10*3/uL (ref 4.0–10.5)
nRBC: 0 % (ref 0.0–0.2)

## 2020-03-30 LAB — CMP (CANCER CENTER ONLY)
ALT: 18 U/L (ref 0–44)
AST: 15 U/L (ref 15–41)
Albumin: 3.8 g/dL (ref 3.5–5.0)
Alkaline Phosphatase: 107 U/L (ref 38–126)
Anion gap: 8 (ref 5–15)
BUN: 13 mg/dL (ref 6–20)
CO2: 21 mmol/L — ABNORMAL LOW (ref 22–32)
Calcium: 10.2 mg/dL (ref 8.9–10.3)
Chloride: 111 mmol/L (ref 98–111)
Creatinine: 0.9 mg/dL (ref 0.44–1.00)
GFR, Estimated: >60 mL/min (ref >60)
Glucose, Bld: 95 mg/dL (ref 70–99)
Potassium: 4.2 mmol/L (ref 3.5–5.1)
Sodium: 140 mmol/L (ref 135–145)
Total Bilirubin: 0.3 mg/dL (ref 0.3–1.2)
Total Protein: 8 g/dL (ref 6.5–8.1)

## 2020-03-30 LAB — GENETIC SCREENING ORDER

## 2020-03-30 NOTE — Progress Notes (Signed)
Anita Joyce   [phone] [phone]   Clinic New Consult Note   Patient Care Team: Carol Ada, MD as PCP - General (Family Medicine) Mauro Kaufmann, RN as Oncology Nurse Navigator Rockwell Germany, RN as Oncology Nurse Navigator  Date of Service:  03/30/2020   CHIEF COMPLAINTS/PURPOSE OF CONSULTATION:  Newly Diagnosed right breast DCIS   Oncology History Overview Note  Cancer Staging No matching staging information was found for the patient.    Ductal carcinoma in situ (DCIS) of right breast (Resolved)  02/29/2020 Mammogram   Right breast mammogram  IMPRESSION The 1.5cm linear branching pleomorphic calcifications in the upper outer aspect middle depth right breast, 3cmfn, are suspicious of malignancy. A stereotactic biopsy is recommended.     03/16/2020 Initial Biopsy   Diagnosis Breast, right, needle core biopsy - HIGH GRADE DUCTAL CARCINOMA IN SITU WITH CENTRAL NECROSIS AND CALCIFICATIONS. Microscopic Comment Ancillary studies will be reported separately. Results reported to [facility] on 03/17/2020. Dr. Melina Copa reviewed.    ER- 30% positive, moderately staining intensity  PR- 0% negative    03/23/2020 Initial Diagnosis   Ductal carcinoma in situ (DCIS) of right breast      HISTORY OF PRESENTING ILLNESS:  Anita Joyce 55 y.o. female is a here because of newly diagnosed right breast DCIS. The patient presents to the Breast clinic today alone.   She notes her right breast mass was found from screening mammogram. She did not feel her mass herself. She denies recent breast or skin changes. She notes she has breast implants and would like to have them removed before she retire.    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 She lives alone and has no children. Her parents have passed and her sister has colon cancer.    She has a PMHx of she notes she was diagnose in her 49s with bipolar  disorder. She is on lithium and managed by her PCP. She has not had any episodes since she was 25. She has parathyroid with elevated calcium levels. She is seen by Dr Weyman Rodney. She plans to have bone scan in 06/2020. She also has HTN and Hypothyroidism. She had ovarian surgery to laser off adhesions. She still has ovaries but post-menopausal. She has had cholecystectomy, breast augmentation, knee surgery. She has concern for lynch syndrome. Her mother had breast cancer at 23 and colon cancer at 20. Her sister has stage IV Colon cancer recently diagnosed. Her maternal mother had breast cancer.    GYN HISTORY  Menarchal: age 75 LMP: 38 at age 71 Contraceptive: 10-12 years  HRT:  No G0   REVIEW OF SYSTEMS:    Constitutional: Denies fevers, chills or abnormal night sweats Eyes: Denies blurriness of vision, double vision or watery eyes Ears, nose, mouth, throat, and face: Denies mucositis or sore throat Respiratory: Denies cough, dyspnea or wheezes Cardiovascular: Denies palpitation, chest discomfort or lower extremity swelling Gastrointestinal:  Denies nausea, heartburn or change in bowel habits Skin: Denies abnormal skin rashes Lymphatics: Denies new lymphadenopathy or easy bruising Neurological:Denies numbness, tingling or new weaknesses Behavioral/Psych: Mood is stable, no new changes  All other systems were reviewed with the patient and are negative.   MEDICAL HISTORY:  Past Medical History:  Diagnosis Date  . Bipolar 1 disorder ([place])   . Hypercalcemia   . Hypertension   . Hypothyroidism   . IBS (irritable bowel syndrome)     SURGICAL HISTORY: Past Surgical History:  Procedure Laterality Date  . CHOLECYSTECTOMY    . OVARY SURGERY Left   . PLACEMENT OF BREAST IMPLANTS  1982  . REPLACEMENT TOTAL KNEE Left     SOCIAL HISTORY: Social History   Socioeconomic History  . Marital status: Single    Spouse name: Not on file  . Number of children: 0  . Years of education: Not  on file  . Highest education level: Not on file  Occupational History  . Occupation: Government social research officer   Tobacco Use  . Smoking status: Never Smoker  . Smokeless tobacco: Never Used  Substance and Sexual Activity  . Alcohol use: Yes  . Drug use: Never  . Sexual activity: Not on file  Other Topics Concern  . Not on file  Social History Narrative  . Not on file   Social Determinants of Health   Financial Resource Strain: Not on file  Food Insecurity: Not on file  Transportation Needs: Not on file  Physical Activity: Not on file  Stress: Not on file  Social Connections: Not on file  Intimate Partner Violence: Not on file    FAMILY HISTORY: Family History  Problem Relation Age of Onset  . Colon cancer Mother 54  . Breast cancer Mother 63  . Von Willebrand disease Father   . Breast cancer Maternal Grandmother   . Colon cancer Sister 21    ALLERGIES:  has No Known Allergies.  MEDICATIONS:  Current Outpatient Medications  Medication Sig Dispense Refill  . amLODipine (NORVASC) 10 MG tablet Take 10 mg by mouth at bedtime.    Marland Kitchen b complex vitamins tablet Take 1 tablet by mouth at bedtime.    . dicyclomine (BENTYL) 10 MG capsule Take 10 mg by mouth 2 (two) times daily.    Marland Kitchen FOLIC ACID PO Take 1 tablet by mouth at bedtime.    . Lactobacillus (AZO COMPLETE FEMININE BALANCE) CAPS Take 1 capsule by mouth at bedtime.     Marland Kitchen levothyroxine (SYNTHROID, LEVOTHROID) 150 MCG tablet Take 150 mcg by mouth daily before breakfast.    . lithium carbonate (LITHOBID) 300 MG CR tablet Take 300-1,200 mg by mouth See admin instructions. Take four tablets (1200 mg) by mouth every morning and one tablet (300 mg) at night    . metoprolol succinate (TOPROL-XL) 25 MG 24 hr tablet Take 25 mg by mouth at bedtime.    . Multiple Vitamin (MULTIVITAMIN WITH MINERALS) TABS tablet Take 1 tablet by mouth at bedtime.    . Multiple Vitamins-Minerals (HAIR/SKIN/NAILS/BIOTIN) TABS Take 1 tablet by mouth at bedtime.     Marland Kitchen omeprazole (PRILOSEC OTC) 20 MG tablet Take 20 mg by mouth See admin instructions. Take one tablet (20 mg) by mouth every other night     No current facility-administered medications for this visit.    PHYSICAL EXAMINATION: ECOG PERFORMANCE STATUS: 0 - Asymptomatic  Vitals:   03/30/20 1257  BP: 119/85  Pulse: 93  Resp: 20  Temp: 97.9 F (36.6 C)  SpO2: 99%   Filed Weights   03/30/20 1257  Weight: 273 lb 8 oz (124.1 kg)    GENERAL:alert, no distress and comfortable SKIN: skin color, texture, turgor are normal, no rashes or significant lesions EYES: normal, Conjunctiva are pink and non-injected, sclera clear  NECK: supple, thyroid normal size, non-tender, without nodularity LYMPH:  no palpable lymphadenopathy in the cervical, axillary  LUNGS: clear to auscultation and percussion with normal breathing effort HEART: regular rate & rhythm and no murmurs and no lower extremity  edema ABDOMEN:abdomen soft, non-tender and normal bowel sounds Musculoskeletal:no cyanosis of digits and no clubbing  NEURO: alert & oriented x 3 with fluent speech, no focal motor/sensory deficits BREAST: (+) Right breast slightly larger then left (+)B/l Breast implant in place. No palpable mass, nodules or adenopathy bilaterally. Breast exam benign.  LABORATORY DATA:  I have reviewed the data as listed CBC Latest Ref Rng & Units 03/30/2020 02/08/2018  WBC 4.0 - 10.5 K/uL 8.0 6.9  Hemoglobin 12.0 - 15.0 g/dL 13.2 14.6  Hematocrit 36.0 - 46.0 % 40.8 46.7(H)  Platelets 150 - 400 K/uL 248 281    CMP Latest Ref Rng & Units 03/30/2020 02/08/2018  Glucose 70 - 99 mg/dL 95 85  BUN 6 - 20 mg/dL 13 14  Creatinine 0.44 - 1.00 mg/dL 0.90 0.94  Sodium 135 - 145 mmol/L 140 139  Potassium 3.5 - 5.1 mmol/L 4.2 3.8  Chloride 98 - 111 mmol/L 111 107  CO2 22 - 32 mmol/L 21(L) 22  Calcium 8.9 - 10.3 mg/dL 10.2 10.2  Total Protein 6.5 - 8.1 g/dL 8.0 7.9  Total Bilirubin 0.3 - 1.2 mg/dL 0.3 0.6  Alkaline Phos 38 -  126 U/L 107 97  AST 15 - 41 U/L 15 27  ALT 0 - 44 U/L 18 21     RADIOGRAPHIC STUDIES: I have personally reviewed the radiological images as listed and agreed with the findings in the report. No results found.  ASSESSMENT & PLAN:  Anita Joyce is a 55 y.o. Caucasian female with a history of HTN, Hypothyroidism, Hypercalcemia, Bipolar Disorder   1. Right breast DCIS, High grade, ER 30% +, PR- -I discussed her breast imaging and needle biopsy results with patient and her family members in great detail. She has a 1.5cm right breast mass found to be high grade DCIS with necrosis and calcifications. -She is a candidate for breast conservation surgery. She has been seen by breast surgeon Dr. Barry Dienes, who recommends lumpectomy.  -She has had breast implants since she was 40. She plans to have them removed before she retires -Her DCIS will be cured by complete surgical resection. Any form of adjuvant therapy is preventive. -I reviewed her risk and treatment benefits using the Breast Cancer Nomogram from [facility] Based on family, PMx and lifestyle she has a 20% risk of developing future breast cancer in the next 10 years. Her risk would drop to about 8% with RT or Antiestrogen therapy alone. With both adjuvant treatments her risk would decrease to 4%.  -Given her positive ER, I do recommend antiestrogen therapy with Anastrozole or Tamoxifen, which decrease her risk of future breast cancer by ~40%. I would likely recommend Tamoxifen due to her arthritis.  -She will likely benefit from breast radiation if she undergo lumpectomy to decrease the risk of breast cancer. She will discuss this further with Dr Isidore Moos today.  -We also discussed that biopsy may have sampling limitation, we will review her surgical path, to see if she has any invasive carcinoma components. -We discussed breast cancer surveillance after she completes treatment, Including annual mammogram, breast  exam every 6-12 months. I encouraged her to have healthy diet, exercise, weight management and positive mindset.  -Her breast exam today was benign. Labs reviewed with patient, CBC and CMP WNL.  -F/u after surgery and radiation    2. Genetics  -Her mother had breast cancer (at 18) and colon cancer (at 27). Her sister has stage IV Colon cancer recently diagnosed.  Her maternal mother had breast cancer and other cancers.  -She has concern for lynch syndrome. She is interested in full genetic testing. Will proceed with testing today.    3. Comorbidities: HTN, Hypothyroidism, Hypercalcemia, Bipolar Disorder -She notes she was diagnose in her 71s with bipolar disorder. She is on lithium and managd by her PCP. She has not had any episodes since she was 25.    PLAN:  -Genetics today  -Proceed with surgery soon  -F/u after Radiation    No orders of the defined types were placed in this encounter.   All questions were answered. The patient knows to call the clinic with any problems, questions or concerns. The total time spent in the appointment was 50 minutes.     Truitt Merle, MD 03/30/2020 10:44 PM  I, Joslyn Devon, am acting as scribe for Truitt Merle, MD.   I have reviewed the above documentation for accuracy and completeness, and I agree with the above.

## 2020-03-30 NOTE — Progress Notes (Signed)
[facility] Psychosocial Distress Screening Spiritual Care  Met with Anita Joyce in Breast Multidisciplinary Clinic to introduce Support Center team/resources, reviewing distress screen per protocol.  The patient scored a 6 on the Psychosocial Distress Thermometer which indicates moderate distress. Also assessed for distress and other psychosocial needs.   ONCBCN DISTRESS SCREENING 03/30/2020  Screening Type Initial Screening  Distress experienced in past week (1-10) 6  Practical problem type Work/school  Physical Problem type Constipation/diarrhea  Referral to support programs Yes   Anita Joyce reports that work is more stressful than her diagnosis. She is using the perspective that her diagnosis has brought up to help her refocus on herself and her health as priorities. Gratitude, perspective, and faith are helping her cope.   Follow up needed: No. Anita Joyce prefers to contact chaplain for further support as she moves along in her treatment process.   Chaplain Lisa Lundeen, MDiv, [facility] Pager [phone] Voicemail [phone]      

## 2020-03-30 NOTE — Progress Notes (Addendum)
Radiation Oncology         [phone] ________________________________  Initial Outpatient Consultation  Name: Anita Joyce MRN: [id number]  Date: 03/30/2020  DOB: June 24, 1965  [id number], Hal Hope, MD  Stark Klein, MD   REFERRING PHYSICIAN: Stark Klein, MD  DIAGNOSIS:    ICD-10-CM   1. Ductal carcinoma in situ (DCIS) of right breast  D05.11     Stage 0 Right Breast DCIS, ER30% mod positive / PRneg, Grade 3  CHIEF COMPLAINT: Here to discuss management of right breast DCIS  HISTORY OF PRESENT ILLNESS::Anita Joyce is a 55 y.o. female who presented with breast abnormality on the following imaging: bilateral screening mammogram on the date of 02/19/2020. No symptoms were reported at that time. Unilateral diagnostic mammogram of the right breast on 02/29/2020 revealed 1.5 cm linear branching pleomorphic calcifications in the right breast that were suspicious for malignancy. Biopsy on the date of 03/16/2020 showed DCIS with central necrosis and calcifications. ER status: 30% moderately positive ; PR status: Negative; Grade: 3.  She is in her usual state of health otherwise.  She has been vaccinated against COVID, received her second vaccine in December.  She has had breast implants since age 26.  PREVIOUS RADIATION THERAPY: No  PAST MEDICAL HISTORY:  has a past medical history of Bipolar 1 disorder ([place]), Hypercalcemia, Hypertension, Hypothyroidism, and IBS (irritable bowel syndrome).    PAST SURGICAL HISTORY: Past Surgical History:  Procedure Laterality Date  . CHOLECYSTECTOMY    . OVARY SURGERY Left   . PLACEMENT OF BREAST IMPLANTS  1982  . REPLACEMENT TOTAL KNEE Left     FAMILY HISTORY: family history includes Breast cancer in her maternal grandmother; Breast cancer (age of onset: 41) in her mother; Colon cancer (age of onset: 27) in her sister; Colon cancer (age of onset: 68) in her mother; Von Willebrand disease in her father.  SOCIAL HISTORY:  reports that she has  never smoked. She has never used smokeless tobacco. She reports current alcohol use. She reports that she does not use drugs.  ALLERGIES: Patient has no known allergies.  MEDICATIONS:  Current Outpatient Medications  Medication Sig Dispense Refill  . amLODipine (NORVASC) 10 MG tablet Take 10 mg by mouth at bedtime.    Marland Kitchen b complex vitamins tablet Take 1 tablet by mouth at bedtime.    . dicyclomine (BENTYL) 10 MG capsule Take 10 mg by mouth 2 (two) times daily.    Marland Kitchen FOLIC ACID PO Take 1 tablet by mouth at bedtime.    . Lactobacillus (AZO COMPLETE FEMININE BALANCE) CAPS Take 1 capsule by mouth at bedtime.     Marland Kitchen levothyroxine (SYNTHROID, LEVOTHROID) 150 MCG tablet Take 150 mcg by mouth daily before breakfast.    . lithium carbonate (LITHOBID) 300 MG CR tablet Take 300-1,200 mg by mouth See admin instructions. Take four tablets (1200 mg) by mouth every morning and one tablet (300 mg) at night    . metoprolol succinate (TOPROL-XL) 25 MG 24 hr tablet Take 25 mg by mouth at bedtime.    . Multiple Vitamin (MULTIVITAMIN WITH MINERALS) TABS tablet Take 1 tablet by mouth at bedtime.    . Multiple Vitamins-Minerals (HAIR/SKIN/NAILS/BIOTIN) TABS Take 1 tablet by mouth at bedtime.    Marland Kitchen omeprazole (PRILOSEC OTC) 20 MG tablet Take 20 mg by mouth See admin instructions. Take one tablet (20 mg) by mouth every other night     No current facility-administered medications for this encounter.    REVIEW OF SYSTEMS:  As above   PHYSICAL EXAM:  vitals were not taken for this visit.   General: Alert and oriented, in no acute distress HEENT: Head is normocephalic.  Heart: Regular in rate and rhythm with no murmurs, rubs, or gallops. Chest: Clear to auscultation bilaterally, with no rhonchi, wheezes, or rales. Abdomen: Soft, nontender, nondistended, with no rigidity or guarding. Musculoskeletal: symmetric strength and muscle tone throughout. Neurologic:  No obvious focalities. Speech is fluent. Coordination is  intact. Psychiatric: Judgment and insight are intact. Affect is appropriate. Breasts: Postbiopsy changes in right breast. No other palpable masses appreciated in the breasts or axillae bilaterally.    ECOG = 0  0 - Asymptomatic (Fully active, able to carry on all predisease activities without restriction)  1 - Symptomatic but completely ambulatory (Restricted in physically strenuous activity but ambulatory and able to carry out work of a light or sedentary nature. For example, light housework, office work)  2 - Symptomatic, <50% in bed during the day (Ambulatory and capable of all self care but unable to carry out any work activities. Up and about more than 50% of waking hours)  3 - Symptomatic, >50% in bed, but not bedbound (Capable of only limited self-care, confined to bed or chair 50% or more of waking hours)  4 - Bedbound (Completely disabled. Cannot carry on any self-care. Totally confined to bed or chair)  5 - Death   Eustace Pen MM, Creech RH, Tormey DC, et al. [phone]). "Toxicity and response criteria of the [facility]". [place] 5 (6): 649-55   LABORATORY DATA:  Lab Results  Component Value Date   WBC 8.0 03/30/2020   HGB 13.2 03/30/2020   HCT 40.8 03/30/2020   MCV 95.3 03/30/2020   PLT 248 03/30/2020   CMP     Component Value Date/Time   NA 140 03/30/2020 1223   K 4.2 03/30/2020 1223   CL 111 03/30/2020 1223   CO2 21 (L) 03/30/2020 1223   GLUCOSE 95 03/30/2020 1223   BUN 13 03/30/2020 1223   CREATININE 0.90 03/30/2020 1223   CALCIUM 10.2 03/30/2020 1223   PROT 8.0 03/30/2020 1223   ALBUMIN 3.8 03/30/2020 1223   AST 15 03/30/2020 1223   ALT 18 03/30/2020 1223   ALKPHOS 107 03/30/2020 1223   BILITOT 0.3 03/30/2020 1223   GFRNONAA >60 03/30/2020 1223   GFRAA >60 02/08/2018 1421         RADIOGRAPHY: As above   IMPRESSION/PLAN: Right breast DCIS - she anticipates breast conserving surgery  It was a pleasure meeting the  patient today. We discussed the risks, benefits, and side effects of radiotherapy. I recommend radiotherapy to the right breast to reduce her risk of locoregional recurrence by half.  We discussed that radiation would take approximately 4-7 weeks to complete and that I would give the patient a few weeks to heal following surgery before starting treatment planning. We spoke about acute effects including skin irritation and fatigue as well as much less common late effects including internal organ injury or irritation.  We discussed the possibility of scar tissue forming around her breast implants.  She states that she intends to have her breast implants removed in about 3 years.  She does not want to undergo removal of her implants at the same time as her upcoming DCIS surgery however.  We spoke about the latest technology that is used to minimize the risk of late effects for patients undergoing radiotherapy to the breast or chest  wall. No guarantees of treatment were given. The patient is enthusiastic about proceeding with treatment. I look forward to participating in the patient's care.  I will await her referral back to me for postoperative follow-up and eventual CT simulation/treatment planning.  On date of service, in total, I spent 47 minutes on this encounter. Patient was seen in person.   __________________________________________   Eppie Gibson, MD  This document serves as a record of services personally performed by Eppie Gibson, MD. It was created on his behalf by Clerance Lav, a trained medical scribe. The creation of this record is based on the scribe's personal observations and the provider's statements to them. This document has been checked and approved by the attending provider.

## 2020-03-31 ENCOUNTER — Encounter: Payer: Self-pay | Admitting: Genetic Counselor

## 2020-03-31 DIAGNOSIS — Z808 Family history of malignant neoplasm of other organs or systems: Secondary | ICD-10-CM | POA: Insufficient documentation

## 2020-03-31 DIAGNOSIS — Z800 Family history of malignant neoplasm of digestive organs: Secondary | ICD-10-CM | POA: Insufficient documentation

## 2020-03-31 DIAGNOSIS — Z803 Family history of malignant neoplasm of breast: Secondary | ICD-10-CM | POA: Insufficient documentation

## 2020-03-31 NOTE — Progress Notes (Signed)
REFERRING PROVIDER: Truitt Merle, MD [place],  [place] [address]  PRIMARY PROVIDER:  Carol Ada, MD  PRIMARY REASON FOR VISIT:  1. Ductal carcinoma in situ (DCIS) of right breast   2. Family history of breast cancer   3. Family history of colon cancer   4. Family history of melanoma      I connected with Ms. Diantonio on 03/30/2020 at 3:45pm EDT by video conference and verified that I am speaking with the correct person using two identifiers.   Patient location: [facility] Provider location: [place] Office  HISTORY OF PRESENT ILLNESS:   Ms. Mannina, a 55 y.o. female, was seen for a [place] cancer genetics consultation at the request of Dr. Burr Medico due to a personal and family history of cancer.  Ms. Meharg presents to clinic today to discuss the possibility of a hereditary predisposition to cancer, genetic testing, and to further clarify her future cancer risks, as well as potential cancer risks for family members.   In January 2022, at the age of 57, Ms. Mcbain was diagnosed with ductal carcinoma in situ of the right breast. The tumor is ER+/PR-. The treatment plan includes surgery, radiation therapy, and antiestrogen therapy.   CANCER HISTORY:  Oncology History Overview Note  Cancer Staging No matching staging information was found for the patient.    Ductal carcinoma in situ (DCIS) of right breast (Resolved)  02/29/2020 Mammogram   Right breast mammogram  IMPRESSION The 1.5cm linear branching pleomorphic calcifications in the upper outer aspect middle depth right breast, 3cmfn, are suspicious of malignancy. A stereotactic biopsy is recommended.     03/16/2020 Initial Biopsy   Diagnosis Breast, right, needle core biopsy - HIGH GRADE DUCTAL CARCINOMA IN SITU WITH CENTRAL NECROSIS AND CALCIFICATIONS. Microscopic Comment Ancillary studies will be reported separately. Results reported to [employer] on 03/17/2020. Dr. Melina Copa reviewed.    ER- 30% positive, moderately staining intensity  PR- 0% negative    03/23/2020 Initial Diagnosis   Ductal carcinoma in situ (DCIS) of right breast     RISK FACTORS:  Menarche was at age 22.  No live births.  OCP use for approximately 10-12 years.  Ovaries intact: yes.  Hysterectomy: no.  Menopausal status: postmenopausal.  HRT use: 0 years. Colonoscopy: yes. Mammogram within the last year: yes.   Past Medical History:  Diagnosis Date  . Bipolar 1 disorder ([place])   . Family history of breast cancer   . Family history of colon cancer   . Family history of melanoma   . Hypercalcemia   . Hypertension   . Hypothyroidism   . IBS (irritable bowel syndrome)     Past Surgical History:  Procedure Laterality Date  . CHOLECYSTECTOMY    . OVARY SURGERY Left   . PLACEMENT OF BREAST IMPLANTS  1982  . REPLACEMENT TOTAL KNEE Left     Social History   Socioeconomic History  . Marital status: Single    Spouse name: Not on file  . Number of children: 0  . Years of education: Not on file  . Highest education level: Not on file  Occupational History  . Occupation: Government social research officer   Tobacco Use  . Smoking status: Never Smoker  . Smokeless tobacco: Never Used  Substance and Sexual Activity  . Alcohol use: Yes  . Drug use: Never  . Sexual activity: Not on file  Other Topics Concern  . Not on file  Social  History Narrative  . Not on file   Social Determinants of Health   Financial Resource Strain: Not on file  Food Insecurity: Not on file  Transportation Needs: Not on file  Physical Activity: Not on file  Stress: Not on file  Social Connections: Not on file     FAMILY HISTORY:  We obtained a detailed, 4-generation family history.  Significant diagnoses are listed below: Family History  Problem Relation Age of Onset  . Colon cancer Mother 42  . Breast cancer Mother 59  . Von Willebrand disease Father   . Breast  cancer Maternal Grandmother        dx early 78s  . Colon cancer Sister 79  . Heart attack Maternal Uncle   . Melanoma Maternal Grandfather   . Pneumonia Paternal Grandfather    Ms. Lamore does not have children. She has one sister and one brother. Her sister was recently diagnosed with colon cancer at age 2. It sounds like this colon cancer had abnormal tumor screening for Lynch syndrome.   Ms. Thoennes's mother died at age 51 and had a history of breast cancer (diagnosed age 51) and colon cancer (diagnosed age 107). Ms. Rother had one maternal uncle. Her maternal grandmother died in her early 21s and had a history of breast cancer (diagnosed in her early 36s). Her maternal grandfather died at age 51 and had a history of melanoma.   Ms. Escutia's father died at age 25 and had von Willebrand disease diagnosed at age 47. She had one paternal aunt. Her paternal grandmother died at age 49 and may have had cervical cancer, although the patient is unsure. Her paternal grandfather died in his late 9s from pneumonia.  Ms. Smithhart is unaware of previous family history of genetic testing for hereditary cancer risks. Patient's maternal ancestors are of [place] and [place] descent, and paternal ancestors are of [place] descent. There is no reported Ashkenazi Jewish ancestry. There is no known consanguinity.  GENETIC COUNSELING ASSESSMENT: Ms. Douglass is a 55 y.o. female with a personal history of breast cancer and a family history of breast cancer, colon cancer, and melanoma, which is somewhat suggestive of a hereditary cancer syndrome and predisposition to cancer. We, therefore, discussed and recommended the following at today's visit.   DISCUSSION: We discussed that approximately 5-10% of breast cancer is hereditary, with most cases associated with the BRCA1 and BRCA2 genes. There are other genes that can be associated with hereditary breast cancer syndromes. These include ATM, CHEK2, PALB2, etc.  We discussed that testing is beneficial for several reasons, including knowing about other cancer risks, identifying potential screening and risk-reduction options that may be appropriate, and to understand if other family members could be at risk for cancer and allow them to undergo genetic testing.   Ms. Basley also has concerns regarding Lynch syndrome given her family history of colon cancer. She notes that her sister's pathology report indicated a higher risk for Lynch syndrome (report not available for review). We discussed that all colorectal cancers are recommended to undergo screening tests (mismatch repair protein immunohistochemistry and microsatellite instability) to determine whether the patient may have Lynch syndrome. If her sister's Lynch syndrome screening on her tumor was abnormal, it is important to understand that we cannot know if she has Lynch syndrome without further genetic testing. Ms. Niemczyk's genetic testing will include analysis of the Lynch syndrome genes (EPCAM, MLH1, MSH2, MSH6, PMS2).  We reviewed the characteristics, features and inheritance patterns of hereditary cancer syndromes.  We also discussed genetic testing, including the appropriate family members to test, the process of testing, insurance coverage and turn-around-time for results. We discussed the implications of a negative, positive and/or variant of uncertain significant result. In order to get genetic test results in a timely manner so that Ms. Neiswonger can use these genetic test results for surgical decisions, we recommended Ms. Schnyder pursue genetic testing for the [employer]. Once complete, we recommend Ms. Wojtas pursue reflex genetic testing to the CancerNext-Expanded + RNAinsight gene panel.   The BRCAplus panel offered by [employer] and includes sequencing and deletion/duplication analysis for the following 8 genes: ATM, BRCA1, BRCA2, CDH1, CHEK2, PALB2, PTEN, and TP53. The  CancerNext-Expanded + RNAinsight gene panel offered by [employer] and includes sequencing and rearrangement analysis for the following 77 genes: AIP, ALK, APC, ATM, AXIN2, BAP1, BARD1, BLM, BMPR1A, BRCA1, BRCA2, BRIP1, CDC73, CDH1, CDK4, CDKN1B, CDKN2A, CHEK2, CTNNA1, DICER1, FANCC, FH, FLCN, GALNT12, KIF1B, LZTR1, MAX, MEN1, MET, MLH1, MSH2, MSH3, MSH6, MUTYH, NBN, NF1, NF2, NTHL1, PALB2, PHOX2B, PMS2, POT1, PRKAR1A, PTCH1, PTEN, RAD51C, RAD51D, RB1, RECQL, RET, SDHA, SDHAF2, SDHB, SDHC, SDHD, SMAD4, SMARCA4, SMARCB1, SMARCE1, STK11, SUFU, TMEM127, TP53, TSC1, TSC2, VHL and XRCC2 (sequencing and deletion/duplication); EGFR, EGLN1, HOXB13, KIT, MITF, PDGFRA, POLD1 and POLE (sequencing only); EPCAM and GREM1 (deletion/duplication only). RNA data is routinely analyzed for use in variant interpretation for all genes.  Based on Ms. Sedor's personal and family history of cancer, she meets medical criteria for genetic testing. Despite that she meets criteria, she may still have an out of pocket cost.   PLAN: After considering the risks, benefits, and limitations, Ms. Spurrier provided informed consent to pursue genetic testing and the blood sample was sent to [facility] for analysis of the BRCAplus panel and CancerNext-Expanded + RNAinsight panel. Results should be available within approximately one two weeks' time, at which point they will be disclosed by telephone to Ms. Huettner, as will any additional recommendations warranted by these results. Ms. Gramajo will receive a summary of her genetic counseling visit and a copy of her results once available. This information will also be available in Epic.   Ms. Vazguez's questions were answered to her satisfaction today. Our contact information was provided should additional questions or concerns arise. Thank you for the referral and allowing [place] to share in the care of your patient.   Clint Guy, [place], [facility] Licensed, Certified  Dispensing [email or web address] Phone: [phone]  The patient was seen for a total of 30 minutes in face-to-face genetic counseling.  This patient was discussed with Drs. Magrinat, Lindi Adie and/or Burr Medico who agrees with the above.    _______________________________________________________________________ For Office Staff:  Number of people involved in session: 1 Was an Intern/ student involved with case: no

## 2020-04-01 ENCOUNTER — Telehealth: Payer: Self-pay | Admitting: Hematology

## 2020-04-01 NOTE — Telephone Encounter (Signed)
No 1/19 los. No changes made to pt's schedule.  

## 2020-04-06 DIAGNOSIS — Z1379 Encounter for other screening for genetic and chromosomal anomalies: Secondary | ICD-10-CM | POA: Insufficient documentation

## 2020-04-07 ENCOUNTER — Encounter: Payer: Self-pay | Admitting: *Deleted

## 2020-04-07 ENCOUNTER — Telehealth: Payer: Self-pay | Admitting: Genetic Counselor

## 2020-04-07 ENCOUNTER — Encounter: Payer: Self-pay | Admitting: Genetic Counselor

## 2020-04-07 ENCOUNTER — Telehealth: Payer: Self-pay | Admitting: *Deleted

## 2020-04-07 DIAGNOSIS — D0511 Intraductal carcinoma in situ of right breast: Secondary | ICD-10-CM

## 2020-04-07 NOTE — Telephone Encounter (Signed)
Revealed negative results for the first part of her genetic testing ([employer] BRCAplus panel). Discussed that we do not know why she has breast cancer or why there is cancer in the family. There could be a genetic mutation in the family that Anita Joyce did not inherit. There could also be a mutation in a different gene that we are not testing, or our current technology may not be able detect certain mutations. It will therefore be important for her to stay in contact with genetics to keep up with whether additional testing may be appropriate in the future.   Additional genetic testing through the [employer] CancerNext-Expanded + RNAinsight panel is currently pending. We will call her again once we have those results.

## 2020-04-07 NOTE — Telephone Encounter (Signed)
Spoke with patient to follow up from [facility] 1/19 and assess navigation needs. Clarified some appointments for her regarding her covid test. Encouraged her to call with any other questions or concerns.

## 2020-04-08 ENCOUNTER — Other Ambulatory Visit: Payer: Self-pay

## 2020-04-08 ENCOUNTER — Encounter (HOSPITAL_BASED_OUTPATIENT_CLINIC_OR_DEPARTMENT_OTHER): Payer: Self-pay | Admitting: General Surgery

## 2020-04-11 ENCOUNTER — Other Ambulatory Visit (HOSPITAL_COMMUNITY)
Admission: RE | Admit: 2020-04-11 | Discharge: 2020-04-11 | Disposition: A | Discharge status: Home / Self Care | Payer: BC Managed Care – PPO | Source: Ambulatory Visit | Attending: General Surgery | Admitting: General Surgery

## 2020-04-11 DIAGNOSIS — Z20822 Contact with and (suspected) exposure to covid-19: Secondary | ICD-10-CM | POA: Insufficient documentation

## 2020-04-11 DIAGNOSIS — Z9882 Breast implant status: Secondary | ICD-10-CM | POA: Diagnosis not present

## 2020-04-11 DIAGNOSIS — D0511 Intraductal carcinoma in situ of right breast: Secondary | ICD-10-CM | POA: Diagnosis not present

## 2020-04-11 DIAGNOSIS — Z833 Family history of diabetes mellitus: Secondary | ICD-10-CM | POA: Diagnosis not present

## 2020-04-11 DIAGNOSIS — Z8041 Family history of malignant neoplasm of ovary: Secondary | ICD-10-CM | POA: Diagnosis not present

## 2020-04-11 DIAGNOSIS — C50911 Malignant neoplasm of unspecified site of right female breast: Secondary | ICD-10-CM | POA: Diagnosis not present

## 2020-04-11 DIAGNOSIS — Z170 Estrogen receptor positive status [ER+]: Secondary | ICD-10-CM | POA: Diagnosis not present

## 2020-04-11 DIAGNOSIS — Z01812 Encounter for preprocedural laboratory examination: Secondary | ICD-10-CM | POA: Insufficient documentation

## 2020-04-11 DIAGNOSIS — Z803 Family history of malignant neoplasm of breast: Secondary | ICD-10-CM | POA: Diagnosis not present

## 2020-04-11 DIAGNOSIS — Z800 Family history of malignant neoplasm of digestive organs: Secondary | ICD-10-CM | POA: Diagnosis not present

## 2020-04-11 LAB — SARS CORONAVIRUS 2 (TAT 6-24 HRS): SARS Coronavirus 2: NEGATIVE

## 2020-04-13 ENCOUNTER — Other Ambulatory Visit: Payer: Self-pay

## 2020-04-13 ENCOUNTER — Telehealth: Payer: Self-pay | Admitting: Genetic Counselor

## 2020-04-13 ENCOUNTER — Encounter (HOSPITAL_COMMUNITY): Payer: Self-pay | Admitting: General Surgery

## 2020-04-13 DIAGNOSIS — D0511 Intraductal carcinoma in situ of right breast: Secondary | ICD-10-CM | POA: Diagnosis not present

## 2020-04-13 MED ORDER — DEXTROSE 5 % IV SOLN
3.0000 g | INTRAVENOUS | Status: AC
  Administered 2020-04-14: 3 g via INTRAVENOUS
  Filled 2020-04-13: qty 3
  Filled 2020-04-13: qty 3000

## 2020-04-13 NOTE — H&P (Signed)
[place] Appointment: 03/30/2020 1:00 PM Location: [facility] Patient #: [id number] DOB: 1965/04/17 Undefined / Language: Cleophus Molt / Race: White Female   History of Present Illness Stark Klein MD; 03/30/2020 2:44 PM) The patient is a 55 year old female who presents with breast cancer.Pt is a lovely 55 yo F who is referred for consultation by Dr. Luan Pulling for a new diagnosis of right breast cancer. The patient had screening detected calcifications. Diagnostic imaging was performed and this showed 1.5 cm of calcs in the UOQ of the right breast. Core needle biopsy was performed and showed high grade DCIS with necrosis and calcifications. Of note, she has implants in place.   Of note, her mother and maternal grandmother had breast cancer, both around age 56. She also has a strong family history of colon cancer. She is nulliparous with menarche at age 81 and menopause in the late 50s.    pathology 03/17/2019 HIGH GRADE DUCTAL CARCINOMA IN SITU WITH CENTRAL NECROSIS AND CALCIFICATIONS. ER +/PR -  Images and reports from [place] are reviewed today. Dx mammogram from 02/29/2020. Breast density A  Labs CMET and CBC 03/30/2020 essentially normal.    Past Surgical History Conni Slipper, RN; 03/30/2020 8:09 AM) Breast Biopsy  Right. Colon Polyp Removal - Colonoscopy  Gallbladder Surgery - Laparoscopic  Knee Surgery  Left. Tonsillectomy   Diagnostic Studies History Conni Slipper, RN; 03/30/2020 8:09 AM) Colonoscopy  within last year Mammogram  within last year Pap Smear  >5 years ago  Medication History Conni Slipper, RN; 03/30/2020 8:09 AM) Medications Reconciled  Social History Conni Slipper, RN; 03/30/2020 8:09 AM) Alcohol use  Occasional alcohol use. Caffeine use  Carbonated beverages, Coffee. No drug use  Tobacco use  Never smoker.  Family History Conni Slipper, RN; 03/30/2020 8:09 AM) Arthritis  Father, Mother. Breast Cancer  Mother. Cerebrovascular  Accident  Mother. Colon Cancer  Mother, Sister. Diabetes Mellitus  Father, Mother. Ovarian Cancer  Family Members In General. Thyroid problems  Mother.  Pregnancy / Birth History Conni Slipper, RN; 03/30/2020 8:09 AM) Age at menarche  42 years. Age of menopause  74-50 Contraceptive History  Oral contraceptives. Gravida  0 Para  0 Regular periods   Other Problems Conni Slipper, RN; 03/30/2020 8:09 AM) Gastroesophageal Reflux Disease  High blood pressure     Review of Systems Conni Slipper RN; 03/30/2020 8:09 AM) General Not Present- Appetite Loss, Chills, Fatigue, Fever, Night Sweats, Weight Gain and Weight Loss. Skin Not Present- Change in Wart/Mole, Dryness, Hives, Jaundice, New Lesions, Non-Healing Wounds, Rash and Ulcer. HEENT Not Present- Earache, Hearing Loss, Hoarseness, Nose Bleed, Oral Ulcers, Ringing in the Ears, Seasonal Allergies, Sinus Pain, Sore Throat, Visual Disturbances, Wears glasses/contact lenses and Yellow Eyes. Respiratory Not Present- Bloody sputum, Chronic Cough, Difficulty Breathing, Snoring and Wheezing. Breast Not Present- Breast Mass, Breast Pain, Nipple Discharge and Skin Changes. Cardiovascular Not Present- Chest Pain, Difficulty Breathing Lying Down, Leg Cramps, Palpitations, Rapid Heart Rate, Shortness of Breath and Swelling of Extremities. Gastrointestinal Not Present- Abdominal Pain, Bloating, Bloody Stool, Change in Bowel Habits, Chronic diarrhea, Constipation, Difficulty Swallowing, Excessive gas, Gets full quickly at meals, Hemorrhoids, Indigestion, Nausea, Rectal Pain and Vomiting. Female Genitourinary Not Present- Frequency, Nocturia, Painful Urination, Pelvic Pain and Urgency. Musculoskeletal Not Present- Back Pain, Joint Pain, Joint Stiffness, Muscle Pain, Muscle Weakness and Swelling of Extremities. Neurological Not Present- Decreased Memory, Fainting, Headaches, Numbness, Seizures, Tingling, Tremor, Trouble walking and  Weakness. Psychiatric Not Present- Anxiety, Bipolar, Change in Sleep Pattern, Depression, Fearful  and Frequent crying. Endocrine Not Present- Cold Intolerance, Excessive Hunger, Hair Changes, Heat Intolerance, Hot flashes and New Diabetes. Hematology Not Present- Blood Thinners, Easy Bruising, Excessive bleeding, Gland problems, HIV and Persistent Infections.  Vitals Stark Klein MD; 03/30/2020 2:44 PM) 03/30/2020 2:43 PM Weight: 273.5 lb Height: 69in Body Surface Area: 2.36 m Body Mass Index: 40.39 kg/m  Temp.: 97.75F  Pulse: 93 (Regular)  Resp.: 20 (Unlabored)  BP: 119/85(Sitting, Left Arm, Standard)       Physical Exam Stark Klein MD; 03/30/2020 11:32 AM) General Mental Status-Alert. General Appearance-Consistent with stated age. Hydration-Well hydrated. Voice-Normal.  Head and Neck Head-normocephalic, atraumatic with no lesions or palpable masses. Trachea-midline. Thyroid Gland Characteristics - normal size and consistency.  Eye Eyeball - Bilateral-Extraocular movements intact. Sclera/Conjunctiva - Bilateral-No scleral icterus.  Chest and Lung Exam Chest and lung exam reveals -quiet, even and easy respiratory effort with no use of accessory muscles and on auscultation, normal breath sounds, no adventitious sounds and normal vocal resonance. Inspection Chest Wall - Normal. Back - normal.  Breast Note: breasts are relatively symmetric. mostly fatty replaced. no palpable masses. no nipple retraction or skin dimpling is present. no LAD. no lymphedema. no nipple discharge. Faint bruising on right at biopsy site.   Cardiovascular Cardiovascular examination reveals -normal heart sounds, regular rate and rhythm with no murmurs and normal pedal pulses bilaterally.  Abdomen Inspection Inspection of the abdomen reveals - No Hernias. Palpation/Percussion Palpation and Percussion of the abdomen reveal - Soft, Non Tender, No Rebound  tenderness, No Rigidity (guarding) and No hepatosplenomegaly. Auscultation Auscultation of the abdomen reveals - Bowel sounds normal.  Neurologic Neurologic evaluation reveals -alert and oriented x 3 with no impairment of recent or remote memory. Mental Status-Normal.  Musculoskeletal Global Assessment -Note: no gross deformities.  Normal Exam - Left-Upper Extremity Strength Normal and Lower Extremity Strength Normal. Normal Exam - Right-Upper Extremity Strength Normal and Lower Extremity Strength Normal.  Lymphatic Head & Neck  General Head & Neck Lymphatics: Bilateral - Description - Normal. Axillary  General Axillary Region: Bilateral - Description - Normal. Tenderness - Non Tender. Femoral & Inguinal  Generalized Femoral & Inguinal Lymphatics: Bilateral - Description - No Generalized lymphadenopathy.    Assessment & Plan Stark Klein MD; 03/30/2020 2:45 PM) MALIGNANT NEOPLASM OF UPPER-OUTER QUADRANT OF RIGHT BREAST IN FEMALE, ESTROGEN RECEPTOR POSITIVE (C50.411) Impression: Pt has a new diagnosis of cTis right breast cancer. Patient is a candidate for lumpectomy. We discussed surgery as well as mastectomy. She elects to have lumpectomy.  This would be followed by radiation in the event of breast conservation and antihormone treatment either way. With lumpectomy, no SLN would be performed, but it would with mastectomy. She may also incorporate genetic testing into decision making.  I reviewed risks, recovery, procedure details and more.  The surgical procedure was described to the patient. I discussed the incision type and location and that we would need radiology involved on with a wire or seed marker and/or sentinel node.  The risks and benefits of the procedure were described to the patient and she wishes to proceed.  We discussed the risks bleeding, infection, damage to other structures, need for further procedures/surgeries. We discussed the risk of seroma.  The patient was advised if the area in the breast in cancer, we may need to go back to surgery for additional tissue to obtain negative margins or for a lymph node biopsy. The patient was advised that these are the most common complications, but that others can  occur as well. They were advised against taking aspirin or other anti-inflammatory agents/blood thinners the week before surgery. S/P BILATERAL BREAST IMPLANTS (Z98.82) Impression: Will have to be careful with procedures and surgery to avoid implants. This does not appear close to the implant.  Pt desires implant removal at some point in the future, but not now. FAMILY HISTORY OF BREAST CANCER IN MOTHER (Z80.3) Impression: Will get genetic counseling and testing.    Signed electronically by Stark Klein, MD (03/30/2020 2:45 PM)

## 2020-04-13 NOTE — Progress Notes (Signed)
Patient denies shortness of breath, fever, cough or chest pain.  PCP - Dr Carol Ada Cardiologist - n/a  Chest x-ray - n/a EKG - DOS 04/14/20 Stress Test - n/a ECHO - n/a Cardiac Cath - n/a  STOP now taking any Aspirin (unless otherwise instructed by your surgeon), Aleve, Naproxen, Ibuprofen, Motrin, Advil, Goody's, BC's, all herbal medications, fish oil, and all vitamins.   Coronavirus Screening Covid test on 04/11/20 was negative.  Patient verbalized understanding of instructions that were given via phone.

## 2020-04-13 NOTE — Telephone Encounter (Signed)
LVM that her genetic test results for her reflex genetic testing (including the Lynch syndrome genes) are available and requested that she call back to discuss them.

## 2020-04-14 ENCOUNTER — Encounter (HOSPITAL_COMMUNITY): Payer: Self-pay | Admitting: General Surgery

## 2020-04-14 ENCOUNTER — Encounter (HOSPITAL_COMMUNITY)
Admission: RE | Disposition: A | Discharge status: Home / Self Care | Payer: Self-pay | Source: Home / Self Care | Attending: General Surgery

## 2020-04-14 ENCOUNTER — Ambulatory Visit (HOSPITAL_COMMUNITY): Payer: BC Managed Care – PPO | Admitting: Anesthesiology

## 2020-04-14 ENCOUNTER — Ambulatory Visit (HOSPITAL_COMMUNITY)
Admission: RE | Admit: 2020-04-14 | Discharge: 2020-04-14 | Disposition: A | Discharge status: Home / Self Care | Payer: BC Managed Care – PPO | Attending: General Surgery | Admitting: General Surgery

## 2020-04-14 DIAGNOSIS — C50411 Malignant neoplasm of upper-outer quadrant of right female breast: Secondary | ICD-10-CM | POA: Diagnosis not present

## 2020-04-14 DIAGNOSIS — D0511 Intraductal carcinoma in situ of right breast: Secondary | ICD-10-CM | POA: Diagnosis not present

## 2020-04-14 DIAGNOSIS — E039 Hypothyroidism, unspecified: Secondary | ICD-10-CM | POA: Diagnosis not present

## 2020-04-14 DIAGNOSIS — I10 Essential (primary) hypertension: Secondary | ICD-10-CM | POA: Diagnosis not present

## 2020-04-14 DIAGNOSIS — Z8041 Family history of malignant neoplasm of ovary: Secondary | ICD-10-CM | POA: Diagnosis not present

## 2020-04-14 DIAGNOSIS — Z800 Family history of malignant neoplasm of digestive organs: Secondary | ICD-10-CM | POA: Diagnosis not present

## 2020-04-14 DIAGNOSIS — Z803 Family history of malignant neoplasm of breast: Secondary | ICD-10-CM | POA: Insufficient documentation

## 2020-04-14 DIAGNOSIS — Z20822 Contact with and (suspected) exposure to covid-19: Secondary | ICD-10-CM | POA: Diagnosis not present

## 2020-04-14 DIAGNOSIS — Z833 Family history of diabetes mellitus: Secondary | ICD-10-CM | POA: Diagnosis not present

## 2020-04-14 DIAGNOSIS — Z170 Estrogen receptor positive status [ER+]: Secondary | ICD-10-CM | POA: Diagnosis not present

## 2020-04-14 DIAGNOSIS — Z9882 Breast implant status: Secondary | ICD-10-CM | POA: Insufficient documentation

## 2020-04-14 DIAGNOSIS — C50911 Malignant neoplasm of unspecified site of right female breast: Secondary | ICD-10-CM | POA: Diagnosis not present

## 2020-04-14 HISTORY — PX: BREAST LUMPECTOMY WITH RADIOACTIVE SEED LOCALIZATION: SHX6424

## 2020-04-14 HISTORY — DX: Malignant (primary) neoplasm, unspecified: C80.1

## 2020-04-14 HISTORY — DX: Depression, unspecified: F32.A

## 2020-04-14 HISTORY — DX: Gastro-esophageal reflux disease without esophagitis: K21.9

## 2020-04-14 SURGERY — BREAST LUMPECTOMY WITH RADIOACTIVE SEED LOCALIZATION
Anesthesia: General | Site: Breast | Laterality: Right

## 2020-04-14 MED ORDER — FENTANYL CITRATE (PF) 250 MCG/5ML IJ SOLN
INTRAMUSCULAR | Status: DC | PRN
  Administered 2020-04-14: 25 ug via INTRAVENOUS
  Administered 2020-04-14: 100 ug via INTRAVENOUS

## 2020-04-14 MED ORDER — PROPOFOL 10 MG/ML IV BOLUS
INTRAVENOUS | Status: DC | PRN
  Administered 2020-04-14: 200 mg via INTRAVENOUS

## 2020-04-14 MED ORDER — MEPERIDINE HCL 25 MG/ML IJ SOLN: 6.2500 mg | INTRAMUSCULAR | Status: DC | PRN

## 2020-04-14 MED ORDER — OXYCODONE HCL 5 MG PO TABS: 5.0000 mg | ORAL_TABLET | Freq: Four times a day (QID) | ORAL | 0 refills | Status: AC | PRN

## 2020-04-14 MED ORDER — OXYCODONE HCL 5 MG/5ML PO SOLN
5.0000 mg | Freq: Once | ORAL | Status: DC | PRN
Start: 2020-04-14 — End: 2020-04-16

## 2020-04-14 MED ORDER — BUPIVACAINE-EPINEPHRINE (PF) 0.25% -1:200000 IJ SOLN
INTRAMUSCULAR | Status: AC
  Filled 2020-04-14: qty 20

## 2020-04-14 MED ORDER — ONDANSETRON HCL 4 MG/2ML IJ SOLN
INTRAMUSCULAR | Status: AC
  Filled 2020-04-14: qty 2

## 2020-04-14 MED ORDER — ACETAMINOPHEN 500 MG PO TABS
1000.0000 mg | ORAL_TABLET | ORAL | Status: AC
  Administered 2020-04-14: 1000 mg via ORAL
  Filled 2020-04-14: qty 2

## 2020-04-14 MED ORDER — DEXAMETHASONE SODIUM PHOSPHATE 10 MG/ML IJ SOLN
INTRAMUSCULAR | Status: AC
  Filled 2020-04-14: qty 1

## 2020-04-14 MED ORDER — LIDOCAINE 2% (20 MG/ML) 5 ML SYRINGE
INTRAMUSCULAR | Status: DC | PRN
  Administered 2020-04-14: 100 mg via INTRAVENOUS

## 2020-04-14 MED ORDER — 0.9 % SODIUM CHLORIDE (POUR BTL) OPTIME
TOPICAL | Status: DC | PRN
  Administered 2020-04-14: 1000 mL

## 2020-04-14 MED ORDER — ONDANSETRON HCL 4 MG PO TABS: 4.0000 mg | ORAL_TABLET | Freq: Once | ORAL | Status: DC

## 2020-04-14 MED ORDER — CHLORHEXIDINE GLUCONATE 0.12 % MT SOLN
OROMUCOSAL | Status: AC
  Administered 2020-04-14: 15 mL via OROMUCOSAL
  Filled 2020-04-14: qty 15

## 2020-04-14 MED ORDER — ORAL CARE MOUTH RINSE: 15.0000 mL | Freq: Once | OROMUCOSAL | Status: AC

## 2020-04-14 MED ORDER — PROPOFOL 10 MG/ML IV BOLUS
INTRAVENOUS | Status: AC
  Filled 2020-04-14: qty 20

## 2020-04-14 MED ORDER — FENTANYL CITRATE (PF) 100 MCG/2ML IJ SOLN: 25.0000 ug | INTRAMUSCULAR | Status: DC | PRN

## 2020-04-14 MED ORDER — PHENYLEPHRINE HCL-NACL 10-0.9 MG/250ML-% IV SOLN
INTRAVENOUS | Status: DC | PRN
  Administered 2020-04-14: 60 ug/min via INTRAVENOUS

## 2020-04-14 MED ORDER — SCOPOLAMINE 1 MG/3DAYS TD PT72: 1.0000 | MEDICATED_PATCH | TRANSDERMAL | Status: DC

## 2020-04-14 MED ORDER — AMISULPRIDE (ANTIEMETIC) 5 MG/2ML IV SOLN
INTRAVENOUS | Status: AC
  Filled 2020-04-14: qty 2

## 2020-04-14 MED ORDER — FENTANYL CITRATE (PF) 250 MCG/5ML IJ SOLN
INTRAMUSCULAR | Status: AC
  Filled 2020-04-14: qty 5

## 2020-04-14 MED ORDER — MIDAZOLAM HCL 2 MG/2ML IJ SOLN
INTRAMUSCULAR | Status: AC
  Filled 2020-04-14: qty 2

## 2020-04-14 MED ORDER — ONDANSETRON HCL 4 MG/2ML IJ SOLN
INTRAMUSCULAR | Status: DC | PRN
  Administered 2020-04-14: 4 mg via INTRAVENOUS

## 2020-04-14 MED ORDER — LACTATED RINGERS IV SOLN: INTRAVENOUS | Status: DC

## 2020-04-14 MED ORDER — CHLORHEXIDINE GLUCONATE CLOTH 2 % EX PADS: 6.0000 | MEDICATED_PAD | Freq: Once | CUTANEOUS | Status: DC

## 2020-04-14 MED ORDER — AMISULPRIDE (ANTIEMETIC) 5 MG/2ML IV SOLN
10.0000 mg | Freq: Once | INTRAVENOUS | Status: AC | PRN
  Administered 2020-04-14: 10 mg via INTRAVENOUS

## 2020-04-14 MED ORDER — CHLORHEXIDINE GLUCONATE 0.12 % MT SOLN: 15.0000 mL | Freq: Once | OROMUCOSAL | Status: AC

## 2020-04-14 MED ORDER — OXYCODONE HCL 5 MG PO TABS: 5.0000 mg | ORAL_TABLET | Freq: Once | ORAL | Status: DC | PRN

## 2020-04-14 MED ORDER — LIDOCAINE HCL 1 % IJ SOLN
INTRAMUSCULAR | Status: DC | PRN
  Administered 2020-04-14: 30 mL via INTRAMUSCULAR

## 2020-04-14 MED ORDER — LIDOCAINE HCL (PF) 1 % IJ SOLN
INTRAMUSCULAR | Status: AC
  Filled 2020-04-14: qty 30

## 2020-04-14 MED ORDER — LIDOCAINE 2% (20 MG/ML) 5 ML SYRINGE
INTRAMUSCULAR | Status: AC
  Filled 2020-04-14: qty 5

## 2020-04-14 MED ORDER — MIDAZOLAM HCL 2 MG/2ML IJ SOLN
INTRAMUSCULAR | Status: DC | PRN
  Administered 2020-04-14: 2 mg via INTRAVENOUS

## 2020-04-14 MED ORDER — DEXAMETHASONE SODIUM PHOSPHATE 10 MG/ML IJ SOLN
INTRAMUSCULAR | Status: DC | PRN
  Administered 2020-04-14: 4 mg via INTRAVENOUS

## 2020-04-14 SURGICAL SUPPLY — 43 items
ADH SKN CLS APL DERMABOND .7 (GAUZE/BANDAGES/DRESSINGS) ×1
APL PRP STRL LF DISP 70% ISPRP (MISCELLANEOUS) ×1
BINDER BREAST LRG (GAUZE/BANDAGES/DRESSINGS) IMPLANT
BINDER BREAST XLRG (GAUZE/BANDAGES/DRESSINGS) ×1 IMPLANT
BLADE SURG 10 STRL SS (BLADE) ×2 IMPLANT
CANISTER SUCT 3000ML PPV (MISCELLANEOUS) IMPLANT
CHLORAPREP W/TINT 26 (MISCELLANEOUS) ×2 IMPLANT
CLIP VESOCCLUDE LG 6/CT (CLIP) ×2 IMPLANT
CLIP VESOCCLUDE MED 6/CT (CLIP) ×2 IMPLANT
COVER PROBE W GEL 5X96 (DRAPES) ×2 IMPLANT
COVER SURGICAL LIGHT HANDLE (MISCELLANEOUS) ×2 IMPLANT
COVER WAND RF STERILE (DRAPES) ×1 IMPLANT
DERMABOND ADVANCED (GAUZE/BANDAGES/DRESSINGS) ×1
DERMABOND ADVANCED .7 DNX12 (GAUZE/BANDAGES/DRESSINGS) ×1 IMPLANT
DEVICE DUBIN SPECIMEN MAMMOGRA (MISCELLANEOUS) ×2 IMPLANT
DRAPE CHEST BREAST 15X10 FENES (DRAPES) ×2 IMPLANT
DRSG PAD ABDOMINAL 8X10 ST (GAUZE/BANDAGES/DRESSINGS) ×2 IMPLANT
ELECT COATED BLADE 2.86 ST (ELECTRODE) ×2 IMPLANT
ELECT REM PT RETURN 9FT ADLT (ELECTROSURGICAL) ×2
ELECTRODE REM PT RTRN 9FT ADLT (ELECTROSURGICAL) ×1 IMPLANT
GAUZE SPONGE 4X4 12PLY STRL LF (GAUZE/BANDAGES/DRESSINGS) ×2 IMPLANT
GLOVE BIO SURGEON STRL SZ 6 (GLOVE) ×2 IMPLANT
GLOVE SURG UNDER LTX SZ6.5 (GLOVE) ×2 IMPLANT
GOWN STRL REUS W/ TWL LRG LVL3 (GOWN DISPOSABLE) ×1 IMPLANT
GOWN STRL REUS W/TWL 2XL LVL3 (GOWN DISPOSABLE) ×2 IMPLANT
GOWN STRL REUS W/TWL LRG LVL3 (GOWN DISPOSABLE) ×2
KIT BASIN OR (CUSTOM PROCEDURE TRAY) ×2 IMPLANT
KIT MARKER MARGIN INK (KITS) ×2 IMPLANT
LIGHT WAVEGUIDE WIDE FLAT (MISCELLANEOUS) ×1 IMPLANT
NDL HYPO 25GX1X1/2 BEV (NEEDLE) ×1 IMPLANT
NEEDLE HYPO 25GX1X1/2 BEV (NEEDLE) ×2 IMPLANT
NS IRRIG 1000ML POUR BTL (IV SOLUTION) IMPLANT
PACK GENERAL/GYN (CUSTOM PROCEDURE TRAY) ×2 IMPLANT
STRIP CLOSURE SKIN 1/2X4 (GAUZE/BANDAGES/DRESSINGS) ×2 IMPLANT
SUT MNCRL AB 4-0 PS2 18 (SUTURE) ×2 IMPLANT
SUT SILK 2 0 SH (SUTURE) IMPLANT
SUT VIC AB 2-0 SH 27 (SUTURE) ×2
SUT VIC AB 2-0 SH 27XBRD (SUTURE) ×1 IMPLANT
SUT VIC AB 3-0 SH 27 (SUTURE) ×2
SUT VIC AB 3-0 SH 27X BRD (SUTURE) ×1 IMPLANT
SYR CONTROL 10ML LL (SYRINGE) ×2 IMPLANT
TOWEL GREEN STERILE (TOWEL DISPOSABLE) ×2 IMPLANT
TOWEL GREEN STERILE FF (TOWEL DISPOSABLE) ×2 IMPLANT

## 2020-04-14 NOTE — Anesthesia Preprocedure Evaluation (Addendum)
Anesthesia Evaluation  Patient identified by MRN, date of birth, ID band Patient awake    Reviewed: Allergy & Precautions, NPO status , Patient's Chart, lab work & pertinent test results  Airway Mallampati: II  TM Distance: >3 FB Neck ROM: Full    Dental  (+) Dental Advisory Given, Teeth Intact   Pulmonary neg pulmonary ROS,    Pulmonary exam normal breath sounds clear to auscultation       Cardiovascular hypertension, Pt. on home beta blockers and Pt. on medications Normal cardiovascular exam Rhythm:Regular Rate:Normal     Neuro/Psych PSYCHIATRIC DISORDERS Depression Bipolar Disorder negative neurological ROS     GI/Hepatic Neg liver ROS, GERD  ,  Endo/Other  Hypothyroidism Morbid obesity  Renal/GU negative Renal ROS     Musculoskeletal negative musculoskeletal ROS (+)   Abdominal (+) + obese,   Peds  Hematology negative hematology ROS (+)   Anesthesia Other Findings   Reproductive/Obstetrics                           Anesthesia Physical Anesthesia Plan  ASA: III  Anesthesia Plan: General   Post-op Pain Management:    Induction: Intravenous  PONV Risk Score and Plan: 3 and Ondansetron, Dexamethasone, Midazolam, Treatment may vary due to age or medical condition and Scopolamine patch - Pre-op  Airway Management Planned: LMA  Additional Equipment: None  Intra-op Plan:   Post-operative Plan: Extubation in OR  Informed Consent: I have reviewed the patients History and Physical, chart, labs and discussed the procedure including the risks, benefits and alternatives for the proposed anesthesia with the patient or authorized representative who has indicated his/her understanding and acceptance.     Dental advisory given  Plan Discussed with: CRNA  Anesthesia Plan Comments:        Anesthesia Quick Evaluation

## 2020-04-14 NOTE — Anesthesia Procedure Notes (Signed)
Procedure Name: LMA Insertion Date/Time: 04/14/2020 9:48 AM Performed by: Renato Shin, CRNA Pre-anesthesia Checklist: Patient identified, Emergency Drugs available, Suction available and Patient being monitored Patient Re-evaluated:Patient Re-evaluated prior to induction Oxygen Delivery Method: Circle system utilized Preoxygenation: Pre-oxygenation with 100% oxygen Induction Type: IV induction LMA: LMA inserted LMA Size: 4.0 Number of attempts: 1 Placement Confirmation: positive ETCO2 and breath sounds checked- equal and bilateral Tube secured with: Tape Dental Injury: Teeth and Oropharynx as per pre-operative assessment

## 2020-04-14 NOTE — Transfer of Care (Signed)
Immediate Anesthesia Transfer of Care Note  Patient: Anita Joyce  Procedure(s) Performed: RIGHT BREAST LUMPECTOMY WITH RADIOACTIVE SEED LOCALIZATION (Right Breast)  Patient Location: PACU  Anesthesia Type:General  Level of Consciousness: awake and patient cooperative  Airway & Oxygen Therapy: Patient Spontanous Breathing and Patient connected to nasal cannula oxygen  Post-op Assessment: Report given to RN and Post -op Vital signs reviewed and stable  Post vital signs: Reviewed and stable  Last Vitals:  Vitals Value Taken Time  BP 116/81 04/14/20 1051  Temp 36.7 C 04/14/20 1051  Pulse 74 04/14/20 1056  Resp 13 04/14/20 1056  SpO2 95 % 04/14/20 1056  Vitals shown include unvalidated device data.  Last Pain:  Vitals:   04/14/20 0723  TempSrc:   PainSc: 0-No pain      Patients Stated Pain Goal: 3 (50/93/26 7124)  Complications: No complications documented.

## 2020-04-14 NOTE — Discharge Instructions (Addendum)
[facility] °Office Phone Number [phone] ° °BREAST BIOPSY/ PARTIAL MASTECTOMY: POST OP INSTRUCTIONS ° °Always review your discharge instruction sheet given to you by the facility where your surgery was performed. ° °IF YOU HAVE DISABILITY OR FAMILY LEAVE FORMS, YOU MUST BRING THEM TO THE OFFICE FOR PROCESSING.  DO NOT GIVE THEM TO YOUR DOCTOR. ° °1. A prescription for pain medication may be given to you upon discharge.  Take your pain medication as prescribed, if needed.  If narcotic pain medicine is not needed, then you may take acetaminophen (Tylenol) or ibuprofen (Advil) as needed. °2. Take your usually prescribed medications unless otherwise directed °3. If you need a refill on your pain medication, please contact your pharmacy.  They will contact our office to request authorization.  Prescriptions will not be filled after 5pm or on week-ends. °4. You should eat very light the first 24 hours after surgery, such as soup, crackers, pudding, etc.  Resume your normal diet the day after surgery. °5. Most patients will experience some swelling and bruising in the breast.  Ice packs and a good support bra will help.  Swelling and bruising can take several days to resolve.  °6. It is common to experience some constipation if taking pain medication after surgery.  Increasing fluid intake and taking a stool softener will usually help or prevent this problem from occurring.  A mild laxative (Milk of Magnesia or Miralax) should be taken according to package directions if there are no bowel movements after 48 hours. °7. Unless discharge instructions indicate otherwise, you may remove your bandages 48 hours after surgery, and you may shower at that time.  You may have steri-strips (small skin tapes) in place directly over the incision.  These strips should be left on the skin for 7-10 days.   Any sutures or staples will be removed at the office during your follow-up visit. °8. ACTIVITIES:  You may resume  regular daily activities (gradually increasing) beginning the next day.  Wearing a good support bra or sports bra (or the breast binder) minimizes pain and swelling.  You may have sexual intercourse when it is comfortable. °a. You may drive when you no longer are taking prescription pain medication, you can comfortably wear a seatbelt, and you can safely maneuver your car and apply brakes. °b. RETURN TO WORK:  __________1 week_______________ °9. You should see your doctor in the office for a follow-up appointment approximately two weeks after your surgery.  Your doctor’s nurse will typically make your follow-up appointment when she calls you with your pathology report.  Expect your pathology report 2-3 business days after your surgery.  You may call to check if you do not hear from us after three days. ° ° °WHEN TO CALL YOUR DOCTOR: °1. Fever over 101.0 °2. Nausea and/or vomiting. °3. Extreme swelling or bruising. °4. Continued bleeding from incision. °5. Increased pain, redness, or drainage from the incision. ° °The clinic staff is available to answer your questions during regular business hours.  Please don’t hesitate to call and ask to speak to one of the nurses for clinical concerns.  If you have a medical emergency, go to the nearest emergency room or call 911.  A surgeon from [facility] is always on call at the hospital. ° °For further questions, please visit [email or web address]  ° °

## 2020-04-14 NOTE — Op Note (Signed)
Right Breast Radioactive seed localized lumpectomy  Indications: This patient presents with history of right breast cancer, UOQ quadrant, cTis receptors+/-  Pre-operative Diagnosis: right breast cancer  Post-operative Diagnosis: Same  Surgeon: Stark Klein   Assistant:  Judyann Munson, RNFA  Anesthesia: General endotracheal anesthesia  ASA Class: 3  Procedure Details  The patient was seen in the Holding Room. The risks, benefits, complications, treatment options, and expected outcomes were discussed with the patient. The possibilities of bleeding, infection, the need for additional procedures, failure to diagnose a condition, and creating a complication requiring other procedures or operations were discussed with the patient. The patient concurred with the proposed plan, giving informed consent.  The site of surgery properly noted/marked. The patient was taken to Operating Room # 2, identified, and the procedure verified as right breast seed localized lumpectomy.  The right breast and chest were prepped and draped in standard fashion. A lateral circumareolar incision was made near the previously placed radioactive seed.  Dissection was carried down around the point of maximum signal intensity. The cautery was used to perform the dissection.   The specimen was inked with the margin marker paint kit.    Specimen radiography confirmed inclusion of the mammographic lesion, the clip, and the seed.  The background signal in the breast was zero.  Hemostasis was achieved with cautery.  The cavity was marked with clips on each border other than the anterior border.  The wound was irrigated and closed with 3-0 vicryl interrupted deep dermal sutures and 4-0 monocryl running subcuticular suture.      Sterile dressings were applied. At the end of the operation, all sponge, instrument, and needle counts were correct.   Findings: Seed, clip in specimen.  anterior margin is skin.     Estimated Blood  Loss:  min         Specimens: left breast tissue with seed         Complications:  None; patient tolerated the procedure well.         Disposition: PACU - hemodynamically stable.         Condition: stable

## 2020-04-14 NOTE — Interval H&P Note (Signed)
History and Physical Interval Note:  04/14/2020 7:50 AM  Anita Joyce  has presented today for surgery, with the diagnosis of RIGHT BREAST CANCER.  The various methods of treatment have been discussed with the patient and family. After consideration of risks, benefits and other options for treatment, the patient has consented to  Procedure(s) with comments: RIGHT BREAST LUMPECTOMY WITH RADIOACTIVE SEED LOCALIZATION (Right) - RNFA as a surgical intervention.  The patient's history has been reviewed, patient examined, no change in status, stable for surgery.  I have reviewed the patient's chart and labs.  Questions were answered to the patient's satisfaction.     Stark Klein

## 2020-04-15 ENCOUNTER — Encounter (HOSPITAL_COMMUNITY): Payer: Self-pay | Admitting: General Surgery

## 2020-04-15 LAB — SURGICAL PATHOLOGY

## 2020-04-15 NOTE — Anesthesia Postprocedure Evaluation (Signed)
Anesthesia Post Note  Patient: Anita Joyce  Procedure(s) Performed: RIGHT BREAST LUMPECTOMY WITH RADIOACTIVE SEED LOCALIZATION (Right Breast)     Patient location during evaluation: PACU Anesthesia Type: General Level of consciousness: sedated and patient cooperative Pain management: pain level controlled Vital Signs Assessment: post-procedure vital signs reviewed and stable Respiratory status: spontaneous breathing Cardiovascular status: stable Anesthetic complications: no   No complications documented.  Last Vitals:  Vitals:   04/14/20 1121 04/14/20 1136  BP: 114/84 108/75  Pulse: 78 73  Resp: 13 11  Temp:    SpO2: 94% 96%    Last Pain:  Vitals:   04/14/20 1121  TempSrc:   PainSc: 0-No pain                 Nolon Nations

## 2020-04-19 ENCOUNTER — Inpatient Hospital Stay
Admission: RE | Admit: 2020-04-19 | Discharge: 2020-04-19 | Disposition: A | Discharge status: Home / Self Care | Payer: Self-pay | Source: Ambulatory Visit | Attending: Radiation Oncology | Admitting: Radiation Oncology

## 2020-04-19 ENCOUNTER — Other Ambulatory Visit: Payer: Self-pay | Admitting: Radiation Oncology

## 2020-04-19 DIAGNOSIS — C50919 Malignant neoplasm of unspecified site of unspecified female breast: Secondary | ICD-10-CM

## 2020-04-19 NOTE — Telephone Encounter (Signed)
LVM that her genetic test results are available and requested that she call back to discuss them.  

## 2020-04-20 ENCOUNTER — Encounter: Payer: Self-pay | Admitting: *Deleted

## 2020-04-22 ENCOUNTER — Ambulatory Visit: Payer: Self-pay | Admitting: Genetic Counselor

## 2020-04-22 DIAGNOSIS — Z1379 Encounter for other screening for genetic and chromosomal anomalies: Secondary | ICD-10-CM

## 2020-04-22 NOTE — Progress Notes (Signed)
HPI:  Anita Joyce was previously seen in the [place] clinic due to a personal and family history of cancer and concerns regarding a hereditary predisposition to cancer. Please refer to our prior cancer genetics clinic note for more information regarding our discussion, assessment and recommendations, at the time. Anita Joyce's recent genetic test results were disclosed to her, as were recommendations warranted by these results. These results and recommendations are discussed in more detail below.  CANCER HISTORY:  Oncology History Overview Note  Cancer Staging No matching staging information was found for the patient.    Ductal carcinoma in situ (DCIS) of right breast (Resolved)  02/29/2020 Mammogram   Right breast mammogram  IMPRESSION The 1.5cm linear branching pleomorphic calcifications in the upper outer aspect middle depth right breast, 3cmfn, are suspicious of malignancy. A stereotactic biopsy is recommended.     03/16/2020 Initial Biopsy   Diagnosis Breast, right, needle core biopsy - HIGH GRADE DUCTAL CARCINOMA IN SITU WITH CENTRAL NECROSIS AND CALCIFICATIONS. Microscopic Comment Ancillary studies will be reported separately. Results reported to [facility] on 03/17/2020. Dr. Melina Copa reviewed.    ER- 30% positive, moderately staining intensity  PR- 0% negative    03/23/2020 Initial Diagnosis   Ductal carcinoma in situ (DCIS) of right breast     FAMILY HISTORY:  We obtained a detailed, 4-generation family history.  Significant diagnoses are listed below: Family History  Problem Relation Age of Onset  . Colon cancer Mother 77  . Breast cancer Mother 91  . Von Willebrand disease Father   . Breast cancer Maternal Grandmother        dx early 93s  . Colon cancer Sister 41  . Heart attack Maternal Uncle   . Melanoma Maternal Grandfather   . Pneumonia Paternal Grandfather    Anita Joyce does not have children. She has one sister and  one brother. Her sister was recently diagnosed with colon cancer at age 24. It sounds like this colon cancer had abnormal tumor screening for Lynch syndrome.   Anita Joyce's mother died at age 53 and had a history of breast cancer (diagnosed age 62) and colon cancer (diagnosed age 33). Anita Joyce had one maternal uncle. Her maternal grandmother died in her early 44s and had a history of breast cancer (diagnosed in her early 81s). Her maternal grandfather died at age 10 and had a history of melanoma.   Anita Joyce's father died at age 47 and had von Willebrand disease diagnosed at age 51. She had one paternal aunt. Her paternal grandmother died at age 65 and may have had cervical cancer, although the patient is unsure. Her paternal grandfather died in his late 37s from pneumonia.  Anita Joyce is unaware of previous family history of genetic testing for hereditary cancer risks. Patient's maternal ancestors are of [place] and [place] descent, and paternal ancestors are of [place] descent. There is no reported Ashkenazi Jewish ancestry. There is no known consanguinity.  GENETIC TEST RESULTS: Genetic testing reported out on 04/06/2020 and 04/10/2020 through the [facility] BRCAplus panel and CancerNext-Expanded + RNAinsight panel, respectively. No pathogenic variants were detected.   The CancerNext-Expanded + RNAinsight gene panel offered by [employer] and includes sequencing and rearrangement analysis for the following 77 genes: AIP, ALK, APC, ATM, AXIN2, BAP1, BARD1, BLM, BMPR1A, BRCA1, BRCA2, BRIP1, CDC73, CDH1, CDK4, CDKN1B, CDKN2A, CHEK2, CTNNA1, DICER1, FANCC, FH, FLCN, GALNT12, KIF1B, LZTR1, MAX, MEN1, MET, MLH1, MSH2, MSH3, MSH6, MUTYH, NBN, NF1, NF2, NTHL1, PALB2, PHOX2B, PMS2,  POT1, PRKAR1A, PTCH1, PTEN, RAD51C, RAD51D, RB1, RECQL, RET, SDHA, SDHAF2, SDHB, SDHC, SDHD, SMAD4, SMARCA4, SMARCB1, SMARCE1, STK11, SUFU, TMEM127, TP53, TSC1, TSC2, VHL and XRCC2 (sequencing and deletion/duplication);  EGFR, EGLN1, HOXB13, KIT, MITF, PDGFRA, POLD1 and POLE (sequencing only); EPCAM and GREM1 (deletion/duplication only). RNA data is routinely analyzed for use in variant interpretation for all genes. The test report will be scanned into EPIC and located under the Molecular Pathology section of the Results Review tab.  A portion of the result report is included below for reference.     We discussed with Anita Joyce that because current genetic testing is not perfect, it is possible there may be a gene mutation in one of these genes that current testing cannot detect, but that chance is small.  We also discussed that there could be another gene that has not yet been discovered, or that we have not yet tested, that is responsible for the cancer diagnoses in the family. It is also possible there is a hereditary cause for the cancer in the family that Anita Joyce did not inherit and therefore was not identified in her testing.  Therefore, it is important to remain in touch with cancer genetics in the future so that we can continue to offer Ms. Buchbinder the most up to date genetic testing.   CANCER SCREENING RECOMMENDATIONS: Anita Joyce's test result is considered negative (normal).  This means that we have not identified a hereditary cause for her personal and family history of cancer at this time. While reassuring, this does not definitively rule out a hereditary predisposition to cancer. It is still possible that there could be genetic mutations that are undetectable by current technology. There could be genetic mutations in genes that have not been tested or identified to increase cancer risk.  Therefore, it is recommended she continue to follow the cancer management and screening guidelines provided by her oncology and primary healthcare provider.   An individual's cancer risk and medical management are not determined by genetic test results alone. Overall cancer risk assessment incorporates additional  factors, including personal medical history, family history, and any available genetic information that may result in a personalized plan for cancer prevention and surveillance.  RECOMMENDATIONS FOR FAMILY MEMBERS:  Individuals in this family might be at some increased risk of developing cancer, over the general population risk, simply due to the family history of cancer.  We recommended women in this family have a yearly mammogram beginning at age 6, or 59 years younger than the earliest onset of cancer, an annual clinical breast exam, and perform monthly breast self-exams. Women in this family should also have a gynecological exam as recommended by their primary provider. All family members should be referred for colonoscopy starting at age 17.  FOLLOW-UP: Lastly, we discussed with Anita Joyce that cancer genetics is a rapidly advancing field and it is possible that new genetic tests will be appropriate for her and/or her family members in the future. We encouraged her to remain in contact with cancer genetics on an annual basis so we can update her personal and family histories and let her know of advances in cancer genetics that may benefit this family.   Our contact number was provided. Anita Joyce's questions were answered to her satisfaction, and she knows she is welcome to call us at anytime with additional questions or concerns.   Clint Guy, MS, [facility] Genetic Counselor [email or web address] Phone: [phone]

## 2020-04-22 NOTE — Telephone Encounter (Signed)
Revealed negative genetic testing through the [facility] CancerNext-Expanded + RNAinsight panel. Reviewed that we do not know why there is cancer in the family and that it will be important for her to stay in contact with genetics to keep up with whether additional testing may be appropriate in the future.

## 2020-05-02 ENCOUNTER — Encounter: Payer: Self-pay | Admitting: *Deleted

## 2020-05-02 ENCOUNTER — Encounter: Payer: Self-pay | Admitting: Hematology

## 2020-05-12 ENCOUNTER — Telehealth: Payer: Self-pay | Admitting: Radiation Therapy

## 2020-05-12 NOTE — Telephone Encounter (Signed)
Left a detailed voicemail, including my contact information, about the time change for the appointments with Dr. Isidore Moos on Tuesday 3/8.    Mont Dutton R.T.(R)(T) Radiation Special Procedures Navigator

## 2020-05-16 NOTE — Progress Notes (Signed)
Radiation Oncology         [phone] ________________________________  Name: Anita Joyce MRN: [id number]  Date: 05/17/2020  DOB: [birth year]  Follow-Up Visit Note  Outpatient  CC: Carol Ada, MD  Carol Ada, MD  Diagnosis:      ICD-10-CM   1. Ductal carcinoma in situ (DCIS) of right breast  D05.11 Ambulatory referral to Social Work   STAGE 0, RIGHT BREAST DCIS  CHIEF COMPLAINT: Here to discuss management of right breast DCIS  Narrative:  The patient returns today for follow-up. She was seen in the multidisciplinary breast clinic on 03/30/2020.   Since consultation date, she has not undergone any significant imaging studies.  Breast surgery on the date of 04/14/2020 revealed: DCIS size of 1 cm; histology of ductal carcinoma in situ; margin status to in situ disease of negative by 4 mm; ER status: 30% moderate; PR status: negative; Grade: 3.  Symptomatically, the patient reports:  Lymphedema issues, if any:  Patient denies    Pain issues, if any:  Reports an occasional sharp pain near surgical incision, but states it resolves quickly (and is only about 2 out 10)   SAFETY ISSUES:  Prior radiation? No  Pacemaker/ICD? No  Possible current pregnancy? No--postmenopausal  Is the patient on methotrexate? No   She plans to have her breast implants removed in the next year or two-she has had these implants since she was 24.  She no longer wants to have them, but does not desire to have them immediately removed due to other commitments in her life.  She also reports that she very much wants to go to [place] during some time off from her work and wishes to leave on April 14.  Ideally she would like to have some recovery time from the radiation before she leaves on her trip.        ALLERGIES:  has No Known Allergies.  Meds: Current Outpatient Medications  Medication Sig Dispense Refill  . amLODipine (NORVASC) 10 MG tablet Take 10 mg by mouth at bedtime.    Marland Kitchen b  complex vitamins tablet Take 1 tablet by mouth at bedtime.    . dicyclomine (BENTYL) 10 MG capsule Take 10 mg by mouth 2 (two) times daily.    . folic acid (FOLVITE) 497 MCG tablet Take 800 mcg by mouth at bedtime.    Marland Kitchen levothyroxine (SYNTHROID, LEVOTHROID) 150 MCG tablet Take 150 mcg by mouth daily before breakfast.    . lithium carbonate (LITHOBID) 300 MG CR tablet Take 300-1,200 mg by mouth See admin instructions. Take four tablets (1200 mg) by mouth every morning and one tablet (300 mg) at night    . metoprolol succinate (TOPROL-XL) 50 MG 24 hr tablet Take 50 mg by mouth at bedtime.    . Multiple Vitamin (MULTIVITAMIN WITH MINERALS) TABS tablet Take 1 tablet by mouth at bedtime.    Marland Kitchen omeprazole (PRILOSEC OTC) 20 MG tablet Take 20 mg by mouth at bedtime. Take one tablet (20 mg) by mouth every other night    . oxyCODONE (OXY IR/ROXICODONE) 5 MG immediate release tablet Take 1 tablet (5 mg total) by mouth every 6 (six) hours as needed for severe pain. 15 tablet 0   No current facility-administered medications for this encounter.    Physical Findings:  height is 5\' 9"  (1.753 m) and weight is 274 lb 6 oz (124.5 kg). Her temporal temperature is 97.8 F (36.6 C). Her blood pressure is 112/64 and her pulse is 81.  Her respiration is 18 and oxygen saturation is 100%. .     General: Alert and oriented, in no acute distress Musculoskeletal:  good range of motion in her shoulders Neurologic: No obvious focalities. Speech is fluent.  Psychiatric: Judgment and insight are intact. Affect is appropriate. Breast exam reveals excellent healing of right breast lumpectomy scar.  Bilateral breast implants.  Right breast is smaller than the left breast  Lab Findings: Lab Results  Component Value Date   WBC 8.0 03/30/2020   HGB 13.2 03/30/2020   HCT 40.8 03/30/2020   MCV 95.3 03/30/2020   PLT 248 03/30/2020     Radiographic Findings: No results found.  Impression/Plan: Right breast DCIS  We  discussed adjuvant radiotherapy today.  I recommend hypofractionated radiation therapy to the right breast in order to reduce risk of local recurrence.  We discussed standard fractionation versus hypofractionation.  Standard fractionation would prohibit her from leaving to go to [place] per her goal in mid April.  Hypofractionation would allow her to finish treatment with some time to spare so that she can recover a bit before her trip.  Recent medical literature has demonstrated that hypofractionation is satisfactorily tolerated by patients with a history of previous breast implants.  Furthermore, she plans to have her breast implants removed in the next year or two. I think that this is a reasonable treatment option for her and she is enthusiastic about proceeding.  She will receive 3 weeks of radiation therapy to the whole right breast and then a hypofractionated boost in 3 fractions for a total of 18 treatments.    I reviewed the logistics, benefits, risks, and potential side effects of this treatment in detail. Risks may include but not necessary be limited to acute and late injury tissue in the radiation fields such as skin irritation (change in color/pigmentation, itching, dryness, pain, peeling). She may experience fatigue. We also discussed possible risk of long term cosmetic changes or scar tissue. There is also a smaller risk for lung toxicity,  lymphedema, musculoskeletal changes, rib fragility or induction of a second malignancy, late chronic non-healing soft tissue wound.   We also discussed the risks related to having an implant and undergoing radiation therapy, including tissue wound, capsular contracture, loss of implant, shrinkage or swelling of the breast.  The patient asked good questions which I answered to her satisfaction. She is enthusiastic about proceeding with treatment. A consent form has been signed and placed in her chart.  We will proceed with treatment planning today and turn  around her plan on an expedited path to allow her to start treatment soon in preparation for her trip to [place].   On date of service, in total, I spent 35 minutes on this encounter. Patient was seen in person.  _____________________________________   Eppie Gibson, MD  This document serves as a record of services personally performed by Eppie Gibson, MD. It was created on his behalf by Clerance Lav, a trained medical scribe. The creation of this record is based on the scribe's personal observations and the provider's statements to them. This document has been checked and approved by the attending provider.

## 2020-05-16 NOTE — Progress Notes (Signed)
Location of Breast Cancer: Ductal carcinoma in situ (DCIS) of RIGHT breast  Histology per Pathology Report: 04/14/2020 FINAL MICROSCOPIC DIAGNOSIS:  A. BREAST, RIGHT W/SEED, LUMPECTOMY:  - High grade ductal carcinoma in situ with necrosis, spanning 1 cm.  - Margins are negative for carcinoma.  - Biopsy site.   Receptor Status: ER(30%), PR (Negative)  Did patient present with symptoms (if so, please note symptoms) or was this found on screening mammography?:  Right breast mass was found from screening mammogram. Patient did not feel her mass herself. 02/29/2020: Right breast mammogram IMPRESSION The 1.5cm linear branching pleomorphic calcifications in the upper outer aspect middle depth right breast, 3cmfn, are suspicious of malignancy  Past/Anticipated interventions by surgeon, if any: 04/14/2020 Dr. Stark Klein Right Breast Radioactive seed localized lumpectomy  Past/Anticipated interventions by medical oncology, if any:  Under care of Dr. Truitt Merle 03/30/2020 -I discussed her breast imaging and needle biopsy results with patient and her family members in great detail. She has a 1.5cm right breast mass found to be high grade DCIS with necrosis and calcifications. -She is a candidate for breast conservation surgery. She has been seen by breast surgeon Dr. Barry Dienes, who recommends lumpectomy.  -She has had breast implants since she was 43. She plans to have them removed before she retires -Her DCIS will be cured by complete surgical resection. Any form of adjuvant therapy is preventive. -I reviewed her risk and treatment benefits using the Breast Cancer Nomogram from [facility]). Based on family, PMx and lifestyle she has a 20% risk of developing future breast cancer in the next 10 years. Her risk would drop to about 8% with RT or Antiestrogen therapy alone. With both adjuvant treatments her risk would decrease to 4%.  -Given her positive ER, I do recommend  antiestrogen therapy with Anastrozole or Tamoxifen, which decrease her risk of future breast cancer by ~40%. I would likely recommend Tamoxifen due to her arthritis.  -She will likely benefit from breast radiation if she undergo lumpectomy to decrease the risk of breast cancer. She will discuss this further with Dr Isidore Moos today.  -We also discussed that biopsy may have sampling limitation, we will review her surgical path, to see if she has any invasive carcinoma components. -We discussed breast cancer surveillance after she completes treatment, Including annual mammogram, breast exam every 6-12 months. I encouraged her to have healthy diet, exercise, weight management and positive mindset.  -Her breast exam today was benign. Labs reviewed with patient, CBC and CMP WNL.  -F/u after surgery and radiation   Lymphedema issues, if any:  Patient denies    Pain issues, if any:  Reports an occasional sharp pain near surgingal incision, but states it resolves quickly (and is only about 2 out 10)   SAFETY ISSUES:  Prior radiation? No  Pacemaker/ICD? No  Possible current pregnancy? No--postmenopausal  Is the patient on methotrexate? No  Current Complaints / other details:  Patient has received both Pfizer vaccines, but is not sure if she wants to receive the booster vaccine

## 2020-05-17 ENCOUNTER — Ambulatory Visit
Admission: RE | Admit: 2020-05-17 | Discharge: 2020-05-17 | Disposition: A | Discharge status: Home / Self Care | Payer: BC Managed Care – PPO | Source: Ambulatory Visit | Attending: Radiation Oncology | Admitting: Radiation Oncology

## 2020-05-17 ENCOUNTER — Encounter: Payer: Self-pay | Admitting: Radiation Oncology

## 2020-05-17 ENCOUNTER — Other Ambulatory Visit: Payer: Self-pay

## 2020-05-17 VITALS — BP 112/64 | HR 81 | Temp 97.8°F | Resp 18 | Ht 69.0 in | Wt 274.4 lb

## 2020-05-17 DIAGNOSIS — D0511 Intraductal carcinoma in situ of right breast: Secondary | ICD-10-CM | POA: Diagnosis not present

## 2020-05-17 DIAGNOSIS — Z170 Estrogen receptor positive status [ER+]: Secondary | ICD-10-CM | POA: Insufficient documentation

## 2020-05-17 DIAGNOSIS — Z510 Encounter for antineoplastic radiation therapy: Secondary | ICD-10-CM | POA: Insufficient documentation

## 2020-05-17 DIAGNOSIS — Z79899 Other long term (current) drug therapy: Secondary | ICD-10-CM | POA: Insufficient documentation

## 2020-05-18 ENCOUNTER — Encounter: Payer: Self-pay | Admitting: *Deleted

## 2020-05-18 NOTE — Progress Notes (Signed)
[place] Psychosocial Distress Screening Clinical Social Work  Clinical Social Work was referred by distress screening protocol.  The patient scored a 5 on the Psychosocial Distress Thermometer which indicates moderate distress. Clinical Social Worker contacted patient by phone and left a voicemail offering support and information on the support groups and programs at [facility].  CSW provided contact information and encouraged patient to return call.  ONCBCN DISTRESS SCREENING 05/17/2020  Screening Type Initial Screening  Distress experienced in past week (1-10) 5  Practical problem type Work/school  Physical Problem type   Physician notified of physical symptoms No  Referral to clinical psychology No  Referral to clinical social work Yes  Referral to dietition No  Referral to financial advocate No  Referral to support programs No  Referral to palliative care No    Johnnye Lana, MSW, LCSW, OSW-C Clinical Social Worker [place] [phone]

## 2020-05-19 ENCOUNTER — Encounter: Payer: Self-pay | Admitting: General Practice

## 2020-05-19 NOTE — Progress Notes (Signed)
[place] Psychosocial Distress Screening Clinical Social Work  Clinical Social Work was referred by distress screening protocol.  The patient scored a 5 on the Psychosocial Distress Thermometer which indicates moderate distress. Clinical Social Worker contacted patient by phone to assess for distress and other psychosocial needs. Second attempt to contact.  No answer, unable to leave VM as mailbox was full.  Will attempt again.    ONCBCN DISTRESS SCREENING 05/17/2020  Screening Type Initial Screening  Distress experienced in past week (1-10) 5  Practical problem type Work/school  Physical Problem type   Physician notified of physical symptoms No  Referral to clinical psychology No  Referral to clinical social work Yes  Referral to dietition No  Referral to financial advocate No  Referral to support programs No  Referral to palliative care No    Clinical Social Worker follow up needed: Yes.    If yes, follow up plan:  Try again to reach  Beverely Pace, [place], [place] Worker Phone:  [phone]

## 2020-05-20 ENCOUNTER — Encounter: Payer: Self-pay | Admitting: General Practice

## 2020-05-20 DIAGNOSIS — D0511 Intraductal carcinoma in situ of right breast: Secondary | ICD-10-CM | POA: Diagnosis not present

## 2020-05-20 DIAGNOSIS — Z510 Encounter for antineoplastic radiation therapy: Secondary | ICD-10-CM | POA: Diagnosis not present

## 2020-05-20 DIAGNOSIS — Z170 Estrogen receptor positive status [ER+]: Secondary | ICD-10-CM | POA: Diagnosis not present

## 2020-05-20 NOTE — Progress Notes (Signed)
[place] CSW Progress Notes  Third call to patient to discuss Distress Screen - no answer, unable to leave VM as mailbox is full.  Closing referral at this time as we are unable to contact patient.  Please reconsult if social work services are needed.  Edwyna Shell, LCSW Clinical Social Worker Phone:  [phone]

## 2020-05-23 ENCOUNTER — Encounter: Payer: Self-pay | Admitting: *Deleted

## 2020-05-23 ENCOUNTER — Ambulatory Visit
Admission: RE | Admit: 2020-05-23 | Discharge: 2020-05-23 | Disposition: A | Discharge status: Home / Self Care | Payer: BC Managed Care – PPO | Source: Ambulatory Visit | Attending: Radiation Oncology | Admitting: Radiation Oncology

## 2020-05-23 ENCOUNTER — Other Ambulatory Visit: Payer: Self-pay

## 2020-05-23 DIAGNOSIS — D0511 Intraductal carcinoma in situ of right breast: Secondary | ICD-10-CM

## 2020-05-23 DIAGNOSIS — Z510 Encounter for antineoplastic radiation therapy: Secondary | ICD-10-CM | POA: Diagnosis not present

## 2020-05-23 DIAGNOSIS — Z170 Estrogen receptor positive status [ER+]: Secondary | ICD-10-CM | POA: Diagnosis not present

## 2020-05-23 MED ORDER — RADIAPLEXRX EX GEL: Freq: Once | CUTANEOUS | Status: AC

## 2020-05-23 MED ORDER — ALRA NON-METALLIC DEODORANT (RAD-ONC)
1.0000 "application " | Freq: Once | TOPICAL | Status: AC
  Administered 2020-05-23: 1 via TOPICAL

## 2020-05-23 NOTE — Progress Notes (Signed)

## 2020-05-24 ENCOUNTER — Other Ambulatory Visit: Payer: Self-pay

## 2020-05-24 ENCOUNTER — Ambulatory Visit
Admission: RE | Admit: 2020-05-24 | Discharge: 2020-05-24 | Disposition: A | Discharge status: Home / Self Care | Payer: BC Managed Care – PPO | Source: Ambulatory Visit | Attending: Radiation Oncology | Admitting: Radiation Oncology

## 2020-05-24 ENCOUNTER — Encounter: Payer: Self-pay | Admitting: *Deleted

## 2020-05-24 ENCOUNTER — Telehealth: Payer: Self-pay | Admitting: Hematology

## 2020-05-24 DIAGNOSIS — Z510 Encounter for antineoplastic radiation therapy: Secondary | ICD-10-CM | POA: Diagnosis not present

## 2020-05-24 DIAGNOSIS — D0511 Intraductal carcinoma in situ of right breast: Secondary | ICD-10-CM | POA: Diagnosis not present

## 2020-05-24 DIAGNOSIS — Z170 Estrogen receptor positive status [ER+]: Secondary | ICD-10-CM | POA: Diagnosis not present

## 2020-05-24 NOTE — Telephone Encounter (Signed)
Scheduled appointments per 3/10 sch msg. Pt aware.

## 2020-05-25 ENCOUNTER — Ambulatory Visit
Admission: RE | Admit: 2020-05-25 | Discharge: 2020-05-25 | Disposition: A | Discharge status: Home / Self Care | Payer: BC Managed Care – PPO | Source: Ambulatory Visit | Attending: Radiation Oncology | Admitting: Radiation Oncology

## 2020-05-25 ENCOUNTER — Other Ambulatory Visit: Payer: Self-pay

## 2020-05-25 DIAGNOSIS — Z170 Estrogen receptor positive status [ER+]: Secondary | ICD-10-CM | POA: Diagnosis not present

## 2020-05-25 DIAGNOSIS — D0511 Intraductal carcinoma in situ of right breast: Secondary | ICD-10-CM | POA: Diagnosis not present

## 2020-05-25 DIAGNOSIS — Z510 Encounter for antineoplastic radiation therapy: Secondary | ICD-10-CM | POA: Diagnosis not present

## 2020-05-26 ENCOUNTER — Ambulatory Visit
Admission: RE | Admit: 2020-05-26 | Discharge: 2020-05-26 | Disposition: A | Discharge status: Home / Self Care | Payer: BC Managed Care – PPO | Source: Ambulatory Visit | Attending: Radiation Oncology | Admitting: Radiation Oncology

## 2020-05-26 DIAGNOSIS — Z510 Encounter for antineoplastic radiation therapy: Secondary | ICD-10-CM | POA: Diagnosis not present

## 2020-05-26 DIAGNOSIS — D0511 Intraductal carcinoma in situ of right breast: Secondary | ICD-10-CM | POA: Diagnosis not present

## 2020-05-26 DIAGNOSIS — Z170 Estrogen receptor positive status [ER+]: Secondary | ICD-10-CM | POA: Diagnosis not present

## 2020-05-27 ENCOUNTER — Other Ambulatory Visit: Payer: Self-pay

## 2020-05-27 ENCOUNTER — Ambulatory Visit
Admission: RE | Admit: 2020-05-27 | Discharge: 2020-05-27 | Disposition: A | Discharge status: Home / Self Care | Payer: BC Managed Care – PPO | Source: Ambulatory Visit | Attending: Radiation Oncology | Admitting: Radiation Oncology

## 2020-05-27 DIAGNOSIS — D0511 Intraductal carcinoma in situ of right breast: Secondary | ICD-10-CM | POA: Diagnosis not present

## 2020-05-27 DIAGNOSIS — Z170 Estrogen receptor positive status [ER+]: Secondary | ICD-10-CM | POA: Diagnosis not present

## 2020-05-27 DIAGNOSIS — Z510 Encounter for antineoplastic radiation therapy: Secondary | ICD-10-CM | POA: Diagnosis not present

## 2020-05-30 ENCOUNTER — Other Ambulatory Visit: Payer: Self-pay

## 2020-05-30 ENCOUNTER — Ambulatory Visit
Admission: RE | Admit: 2020-05-30 | Discharge: 2020-05-30 | Disposition: A | Discharge status: Home / Self Care | Payer: BC Managed Care – PPO | Source: Ambulatory Visit | Attending: Radiation Oncology | Admitting: Radiation Oncology

## 2020-05-30 DIAGNOSIS — D0511 Intraductal carcinoma in situ of right breast: Secondary | ICD-10-CM | POA: Diagnosis not present

## 2020-05-30 DIAGNOSIS — Z170 Estrogen receptor positive status [ER+]: Secondary | ICD-10-CM | POA: Diagnosis not present

## 2020-05-30 DIAGNOSIS — Z510 Encounter for antineoplastic radiation therapy: Secondary | ICD-10-CM | POA: Diagnosis not present

## 2020-05-30 MED ORDER — RADIAPLEXRX EX GEL: Freq: Once | CUTANEOUS | Status: AC

## 2020-05-31 ENCOUNTER — Ambulatory Visit
Admission: RE | Admit: 2020-05-31 | Discharge: 2020-05-31 | Disposition: A | Discharge status: Home / Self Care | Payer: BC Managed Care – PPO | Source: Ambulatory Visit | Attending: Radiation Oncology | Admitting: Radiation Oncology

## 2020-05-31 DIAGNOSIS — D0511 Intraductal carcinoma in situ of right breast: Secondary | ICD-10-CM | POA: Diagnosis not present

## 2020-05-31 DIAGNOSIS — Z510 Encounter for antineoplastic radiation therapy: Secondary | ICD-10-CM | POA: Diagnosis not present

## 2020-05-31 DIAGNOSIS — Z170 Estrogen receptor positive status [ER+]: Secondary | ICD-10-CM | POA: Diagnosis not present

## 2020-06-01 ENCOUNTER — Ambulatory Visit: Payer: BC Managed Care – PPO

## 2020-06-02 ENCOUNTER — Ambulatory Visit
Admission: RE | Admit: 2020-06-02 | Discharge: 2020-06-02 | Disposition: A | Discharge status: Home / Self Care | Payer: BC Managed Care – PPO | Source: Ambulatory Visit | Attending: Radiation Oncology | Admitting: Radiation Oncology

## 2020-06-02 ENCOUNTER — Other Ambulatory Visit: Payer: Self-pay

## 2020-06-02 DIAGNOSIS — Z170 Estrogen receptor positive status [ER+]: Secondary | ICD-10-CM | POA: Diagnosis not present

## 2020-06-02 DIAGNOSIS — Z510 Encounter for antineoplastic radiation therapy: Secondary | ICD-10-CM | POA: Diagnosis not present

## 2020-06-02 DIAGNOSIS — D0511 Intraductal carcinoma in situ of right breast: Secondary | ICD-10-CM | POA: Diagnosis not present

## 2020-06-03 ENCOUNTER — Ambulatory Visit
Admission: RE | Admit: 2020-06-03 | Discharge: 2020-06-03 | Disposition: A | Discharge status: Home / Self Care | Payer: BC Managed Care – PPO | Source: Ambulatory Visit | Attending: Radiation Oncology | Admitting: Radiation Oncology

## 2020-06-03 DIAGNOSIS — Z170 Estrogen receptor positive status [ER+]: Secondary | ICD-10-CM | POA: Diagnosis not present

## 2020-06-03 DIAGNOSIS — Z510 Encounter for antineoplastic radiation therapy: Secondary | ICD-10-CM | POA: Diagnosis not present

## 2020-06-03 DIAGNOSIS — D0511 Intraductal carcinoma in situ of right breast: Secondary | ICD-10-CM | POA: Diagnosis not present

## 2020-06-06 ENCOUNTER — Ambulatory Visit
Admission: RE | Admit: 2020-06-06 | Discharge: 2020-06-06 | Disposition: A | Discharge status: Home / Self Care | Payer: BC Managed Care – PPO | Source: Ambulatory Visit | Attending: Radiation Oncology | Admitting: Radiation Oncology

## 2020-06-06 ENCOUNTER — Other Ambulatory Visit: Payer: Self-pay

## 2020-06-06 DIAGNOSIS — Z170 Estrogen receptor positive status [ER+]: Secondary | ICD-10-CM | POA: Diagnosis not present

## 2020-06-06 DIAGNOSIS — I10 Essential (primary) hypertension: Secondary | ICD-10-CM | POA: Diagnosis not present

## 2020-06-06 DIAGNOSIS — E039 Hypothyroidism, unspecified: Secondary | ICD-10-CM | POA: Diagnosis not present

## 2020-06-06 DIAGNOSIS — F3174 Bipolar disorder, in full remission, most recent episode manic: Secondary | ICD-10-CM | POA: Diagnosis not present

## 2020-06-06 DIAGNOSIS — D0511 Intraductal carcinoma in situ of right breast: Secondary | ICD-10-CM | POA: Diagnosis not present

## 2020-06-06 DIAGNOSIS — C50911 Malignant neoplasm of unspecified site of right female breast: Secondary | ICD-10-CM | POA: Diagnosis not present

## 2020-06-06 DIAGNOSIS — Z510 Encounter for antineoplastic radiation therapy: Secondary | ICD-10-CM | POA: Diagnosis not present

## 2020-06-06 MED ORDER — RADIAPLEXRX EX GEL: Freq: Once | CUTANEOUS | Status: AC

## 2020-06-07 ENCOUNTER — Ambulatory Visit
Admission: RE | Admit: 2020-06-07 | Discharge: 2020-06-07 | Disposition: A | Discharge status: Home / Self Care | Payer: BC Managed Care – PPO | Source: Ambulatory Visit | Attending: Radiation Oncology | Admitting: Radiation Oncology

## 2020-06-07 DIAGNOSIS — Z170 Estrogen receptor positive status [ER+]: Secondary | ICD-10-CM | POA: Diagnosis not present

## 2020-06-07 DIAGNOSIS — Z510 Encounter for antineoplastic radiation therapy: Secondary | ICD-10-CM | POA: Diagnosis not present

## 2020-06-07 DIAGNOSIS — D0511 Intraductal carcinoma in situ of right breast: Secondary | ICD-10-CM | POA: Diagnosis not present

## 2020-06-08 ENCOUNTER — Ambulatory Visit
Admission: RE | Admit: 2020-06-08 | Discharge: 2020-06-08 | Disposition: A | Discharge status: Home / Self Care | Payer: BC Managed Care – PPO | Source: Ambulatory Visit | Attending: Radiation Oncology | Admitting: Radiation Oncology

## 2020-06-08 ENCOUNTER — Other Ambulatory Visit: Payer: Self-pay

## 2020-06-08 DIAGNOSIS — D0511 Intraductal carcinoma in situ of right breast: Secondary | ICD-10-CM | POA: Diagnosis not present

## 2020-06-08 DIAGNOSIS — Z170 Estrogen receptor positive status [ER+]: Secondary | ICD-10-CM | POA: Diagnosis not present

## 2020-06-08 DIAGNOSIS — Z510 Encounter for antineoplastic radiation therapy: Secondary | ICD-10-CM | POA: Diagnosis not present

## 2020-06-09 ENCOUNTER — Ambulatory Visit
Admission: RE | Admit: 2020-06-09 | Discharge: 2020-06-09 | Disposition: A | Discharge status: Home / Self Care | Payer: BC Managed Care – PPO | Source: Ambulatory Visit | Attending: Radiation Oncology | Admitting: Radiation Oncology

## 2020-06-09 DIAGNOSIS — Z170 Estrogen receptor positive status [ER+]: Secondary | ICD-10-CM | POA: Diagnosis not present

## 2020-06-09 DIAGNOSIS — D0511 Intraductal carcinoma in situ of right breast: Secondary | ICD-10-CM | POA: Diagnosis not present

## 2020-06-09 DIAGNOSIS — Z510 Encounter for antineoplastic radiation therapy: Secondary | ICD-10-CM | POA: Diagnosis not present

## 2020-06-10 ENCOUNTER — Ambulatory Visit
Admission: RE | Admit: 2020-06-10 | Discharge: 2020-06-10 | Disposition: A | Discharge status: Home / Self Care | Payer: BC Managed Care – PPO | Source: Ambulatory Visit | Attending: Radiation Oncology | Admitting: Radiation Oncology

## 2020-06-10 ENCOUNTER — Other Ambulatory Visit: Payer: Self-pay

## 2020-06-10 DIAGNOSIS — Z510 Encounter for antineoplastic radiation therapy: Secondary | ICD-10-CM | POA: Diagnosis not present

## 2020-06-10 DIAGNOSIS — Z170 Estrogen receptor positive status [ER+]: Secondary | ICD-10-CM | POA: Insufficient documentation

## 2020-06-10 DIAGNOSIS — D0511 Intraductal carcinoma in situ of right breast: Secondary | ICD-10-CM | POA: Diagnosis not present

## 2020-06-10 NOTE — Progress Notes (Signed)
[place]   [phone] Fax:([phone]) [phone]   Clinic Follow up Note   Patient Care Team: Carol Ada, MD as PCP - General (Family Medicine) Mauro Kaufmann, RN as Oncology Nurse Navigator Rockwell Germany, RN as Oncology Nurse Navigator  Date of Service:  06/15/2020  CHIEF COMPLAINT: F/u of right breast DCIS   SUMMARY OF ONCOLOGIC HISTORY: Oncology History Overview Note  Cancer Staging Ductal carcinoma in situ (DCIS) of right breast Staging form: Breast, AJCC 8th Edition - Clinical stage from 03/30/2020: Stage 0 (cTis (DCIS), cN0, cM0, G3, ER+, PR-, HER2: Not Assessed) - Unsigned Stage prefix: Initial diagnosis Laterality: Right Staged by: Pathologist and managing physician Stage used in treatment planning: Yes National guidelines used in treatment planning: Yes Type of national guideline used in treatment planning: NCCN - Pathologic stage from 04/14/2020: pTis (DCIS), G3, ER+, PR-, HER2: Unknown - Signed by Truitt Merle, MD on 06/15/2020 Stage prefix: Initial diagnosis Histologic grading system: 3 grade system Residual tumor (R): R0 - None    Ductal carcinoma in situ (DCIS) of right breast  02/29/2020 Mammogram   Right breast mammogram  IMPRESSION The 1.5cm linear branching pleomorphic calcifications in the upper outer aspect middle depth right breast, 3cmfn, are suspicious of malignancy. A stereotactic biopsy is recommended.     03/16/2020 Initial Biopsy   Diagnosis Breast, right, needle core biopsy - HIGH GRADE DUCTAL CARCINOMA IN SITU WITH CENTRAL NECROSIS AND CALCIFICATIONS. Microscopic Comment Ancillary studies will be reported separately. Results reported to [employer] on 03/17/2020. Dr. Melina Copa reviewed.    ER- 30% positive, moderately staining intensity  PR- 0% negative    03/23/2020 Initial Diagnosis   Ductal carcinoma in situ (DCIS) of right breast   03/30/2020 Genetic Testing   77 gene panel and 8 gene panel with [facility] on 03/30/20 was complete and negative for pathogenetic mutations.    04/14/2020 Surgery   RIGHT BREAST LUMPECTOMY WITH RADIOACTIVE SEED LOCALIZATION by Dr Barry Dienes     04/14/2020 Pathology Results   FINAL MICROSCOPIC DIAGNOSIS:   A. BREAST, RIGHT W/SEED, LUMPECTOMY:  - High grade ductal carcinoma in situ with necrosis, spanning 1 cm.  - Margins are negative for carcinoma.  - Biopsy site.  - See oncology table.    04/14/2020 Cancer Staging   Staging form: Breast, AJCC 8th Edition - Pathologic stage from 04/14/2020: pTis (DCIS), G3, ER+, PR-, HER2: Unknown - Signed by Truitt Merle, MD on 06/15/2020 Stage prefix: Initial diagnosis Histologic grading system: 3 grade system Residual tumor (R): R0 - None   05/23/2020 - 06/16/2020 Radiation Therapy   Adjuvant Radiation with Dr. Isidore Moos 05/23/20-06/16/20      CURRENT THERAPY:  Adjuvant Radiation with Dr. Isidore Moos 05/23/20-06/16/20  INTERVAL HISTORY:  Anita Joyce is here for a follow up. She was last seen by me 2 months ago. She presents to the clinic alone. She notes she has been trying to has been tired but has been working. She is able to go to the gym lately. She notes she tolerating her surgery well. She notes Radiation is doing well and plan to complete tomorrow.     REVIEW OF SYSTEMS:   Constitutional: Denies fevers, chills or abnormal weight loss Eyes: Denies blurriness of vision Ears, nose, mouth, throat, and face: Denies mucositis or sore throat Respiratory: Denies cough, dyspnea or wheezes Cardiovascular: Denies palpitation, chest discomfort or lower extremity swelling Gastrointestinal:  Denies nausea, heartburn or change in bowel habits Skin: Denies abnormal skin rashes  Lymphatics: Denies new lymphadenopathy or easy bruising Neurological:Denies numbness, tingling or new weaknesses Behavioral/Psych: Mood is stable, no new changes  All other systems were reviewed with the patient and are negative.  MEDICAL HISTORY:  Past Medical  History:  Diagnosis Date  . Bipolar 1 disorder ([place])   . Cancer ([place])    breast  . Depression   . Family history of breast cancer   . Family history of colon cancer   . Family history of melanoma   . GERD (gastroesophageal reflux disease)   . Hypercalcemia   . Hypertension   . Hypothyroidism   . IBS (irritable bowel syndrome)     SURGICAL HISTORY: Past Surgical History:  Procedure Laterality Date  . BREAST LUMPECTOMY WITH RADIOACTIVE SEED LOCALIZATION Right 04/14/2020   Procedure: RIGHT BREAST LUMPECTOMY WITH RADIOACTIVE SEED LOCALIZATION;  Surgeon: Stark Klein, MD;  [facility];  Service: General;  Laterality: Right;  RNFA  . CHOLECYSTECTOMY    . COLONOSCOPY     several  . OVARY SURGERY Left    laser adhesions    . PLACEMENT OF BREAST IMPLANTS  1982  . REPLACEMENT TOTAL KNEE Left   . UPPER GI ENDOSCOPY      I have reviewed the social history and family history with the patient and they are unchanged from previous note.  ALLERGIES:  has No Known Allergies.  MEDICATIONS:  Current Outpatient Medications  Medication Sig Dispense Refill  . amLODipine (NORVASC) 10 MG tablet Take 10 mg by mouth at bedtime.    Marland Kitchen b complex vitamins tablet Take 1 tablet by mouth at bedtime.    . dicyclomine (BENTYL) 10 MG capsule Take 10 mg by mouth 2 (two) times daily.    . folic acid (FOLVITE) 638 MCG tablet Take 800 mcg by mouth at bedtime.    Marland Kitchen levothyroxine (SYNTHROID, LEVOTHROID) 150 MCG tablet Take 150 mcg by mouth daily before breakfast.    . lithium carbonate (LITHOBID) 300 MG CR tablet Take 300-1,200 mg by mouth See admin instructions. Take four tablets (1200 mg) by mouth every morning and one tablet (300 mg) at night    . metoprolol succinate (TOPROL-XL) 50 MG 24 hr tablet Take 50 mg by mouth at bedtime.    . Multiple Vitamin (MULTIVITAMIN WITH MINERALS) TABS tablet Take 1 tablet by mouth at bedtime.    Marland Kitchen omeprazole (PRILOSEC OTC) 20 MG tablet Take 20 mg by mouth at bedtime. Take  one tablet (20 mg) by mouth every other night    . oxyCODONE (OXY IR/ROXICODONE) 5 MG immediate release tablet Take 1 tablet (5 mg total) by mouth every 6 (six) hours as needed for severe pain. 15 tablet 0   No current facility-administered medications for this visit.    PHYSICAL EXAMINATION: ECOG PERFORMANCE STATUS: 0 - Asymptomatic  Vitals:   06/15/20 1242  BP: 117/89  Pulse: (!) 111  Resp: 17  Temp: 98.7 F (37.1 C)  SpO2: 95%   Filed Weights   06/15/20 1242  Weight: 277 lb 8 oz (125.9 kg)    GENERAL:alert, no distress and comfortable SKIN: skin color, texture, turgor are normal, no rashes or significant lesions EYES: normal, Conjunctiva are pink and non-injected, sclera clear  NECK: supple, thyroid normal size, non-tender, without nodularity LYMPH:  no palpable lymphadenopathy in the cervical, axillary  LUNGS: clear to auscultation and percussion with normal breathing effort HEART: regular rate & rhythm and no murmurs and no lower extremity edema ABDOMEN:abdomen soft, non-tender and normal bowel sounds  Musculoskeletal:no cyanosis of digits and no clubbing  NEURO: alert & oriented x 3 with fluent speech, no focal motor/sensory deficits BREAST: S/p right lumpectomy: Surgical incision healed well (+) Mild to moderate skin erythema of right breast from RT. No palpable mass, nodules or adenopathy bilaterally. Breast exam benign.   LABORATORY DATA:  I have reviewed the data as listed CBC Latest Ref Rng & Units 06/15/2020 03/30/2020 02/08/2018  WBC 4.0 - 10.5 K/uL 6.1 8.0 6.9  Hemoglobin 12.0 - 15.0 g/dL 14.1 13.2 14.6  Hematocrit 36.0 - 46.0 % 42.8 40.8 46.7(H)  Platelets 150 - 400 K/uL 270 248 281     CMP Latest Ref Rng & Units 06/15/2020 03/30/2020 02/08/2018  Glucose 70 - 99 mg/dL 99 95 85  BUN 6 - 20 mg/dL '8 13 14  ' Creatinine 0.44 - 1.00 mg/dL 0.92 0.90 0.94  Sodium 135 - 145 mmol/L 141 140 139  Potassium 3.5 - 5.1 mmol/L 3.9 4.2 3.8  Chloride 98 - 111 mmol/L 109 111  107  CO2 22 - 32 mmol/L 22 21(L) 22  Calcium 8.9 - 10.3 mg/dL 10.1 10.2 10.2  Total Protein 6.5 - 8.1 g/dL 7.9 8.0 7.9  Total Bilirubin 0.3 - 1.2 mg/dL 0.4 0.3 0.6  Alkaline Phos 38 - 126 U/L 123 107 97  AST 15 - 41 U/L '21 15 27  ' ALT 0 - 44 U/L '26 18 21      ' RADIOGRAPHIC STUDIES: I have personally reviewed the radiological images as listed and agreed with the findings in the report. No results found.   ASSESSMENT & PLAN:  Anita Joyce is a 55 y.o. female with    1. Right breast DCIS, Grade III, ER 30% +, PR- -She was diagnosed in 03/2020 with a 1.5cm right breast mass found to be high grade DCIS with necrosis and calcifications. -She underwent right breast lumpectomy with Dr Barry Dienes on 04/14/20. Surgical path showed high grade 1cm DCIS was completely removed with negative margins. I personally reviewed with patient today.  -Her DCIS was cured by complete surgical resection. Any form of adjuvant therapy is preventive. -She proceeded with adjuvant radiation with Dr Isidore Moos on 05/23/20. Plan to complete on 06/16/20.  -Given her positive ER, I do recommend antiestrogen therapy with Anastrozole or Tamoxifen for 5 years, which decrease her risk of future breast cancer by ~40%. I would likely recommend Tamoxifen due to her arthritis.   ---The potential side effects, which includes but not limited to, hot flash, skin and vaginal dryness, slightly increased risk of cardiovascular disease and cataract, small risk of thrombosis and endometrial cancer, were discussed with her in great details today.  -She has major concern for her weight and other antiestrogen side effects. She would like to have more time to think about this while she works on managing her weight. This is understandable.  -She will proceed with virtual survivorship clinic visit with NP Lacie in 6 months. She is agreeable.   2. Genetics testing on 03/30/20 for 8 gene test was negative for pathogenetic mutations.    3.  Comorbidities: HTN, Hypothyroidism, Hypercalcemia, Bipolar Disorder -She notes she was diagnose in her 10s with bipolar disorder. She is on lithium and managed by her PCP. She has not had any episodes since she was 42.  -She still has thyroid in place and uses levothyroxine. She notes she may be having parathyroid issues as well.  -She notes she struggles to manage her weight with her thyroid issues. She has returned to  gym and mildly changing her diet. I discussed option of [facility] for management. She will think about it.    PLAN:  -she will complete adjuvant radiation tomorrow  -she will think about tamoxifen  -she plans to loose weight  -Survivorship clinic with NP Lacie in 6 months    No problem-specific Assessment & Plan notes found for this encounter.   No orders of the defined types were placed in this encounter.  All questions were answered. The patient knows to call the clinic with any problems, questions or concerns. No barriers to learning was detected. The total time spent in the appointment was 30 minutes.     Truitt Merle, MD 06/15/2020   I, Joslyn Devon, am acting as scribe for Truitt Merle, MD.   I have reviewed the above documentation for accuracy and completeness, and I agree with the above.

## 2020-06-13 ENCOUNTER — Other Ambulatory Visit: Payer: Self-pay

## 2020-06-13 ENCOUNTER — Ambulatory Visit
Admission: RE | Admit: 2020-06-13 | Discharge: 2020-06-13 | Disposition: A | Discharge status: Home / Self Care | Payer: BC Managed Care – PPO | Source: Ambulatory Visit | Attending: Radiation Oncology | Admitting: Radiation Oncology

## 2020-06-13 ENCOUNTER — Ambulatory Visit: Payer: BC Managed Care – PPO

## 2020-06-13 ENCOUNTER — Encounter: Payer: Self-pay | Admitting: *Deleted

## 2020-06-13 ENCOUNTER — Ambulatory Visit: Payer: BC Managed Care – PPO | Admitting: Radiation Oncology

## 2020-06-13 DIAGNOSIS — D0511 Intraductal carcinoma in situ of right breast: Secondary | ICD-10-CM | POA: Diagnosis not present

## 2020-06-13 DIAGNOSIS — Z170 Estrogen receptor positive status [ER+]: Secondary | ICD-10-CM | POA: Diagnosis not present

## 2020-06-13 DIAGNOSIS — Z510 Encounter for antineoplastic radiation therapy: Secondary | ICD-10-CM | POA: Diagnosis not present

## 2020-06-13 DIAGNOSIS — C50911 Malignant neoplasm of unspecified site of right female breast: Secondary | ICD-10-CM

## 2020-06-13 MED ORDER — RADIAPLEXRX EX GEL: Freq: Once | CUTANEOUS | Status: AC

## 2020-06-14 ENCOUNTER — Ambulatory Visit: Payer: BC Managed Care – PPO

## 2020-06-14 ENCOUNTER — Other Ambulatory Visit: Payer: Self-pay | Admitting: Nurse Practitioner

## 2020-06-14 ENCOUNTER — Ambulatory Visit
Admission: RE | Admit: 2020-06-14 | Discharge: 2020-06-14 | Disposition: A | Discharge status: Home / Self Care | Payer: BC Managed Care – PPO | Source: Ambulatory Visit | Attending: Radiation Oncology | Admitting: Radiation Oncology

## 2020-06-14 DIAGNOSIS — D0511 Intraductal carcinoma in situ of right breast: Secondary | ICD-10-CM | POA: Diagnosis not present

## 2020-06-14 DIAGNOSIS — Z170 Estrogen receptor positive status [ER+]: Secondary | ICD-10-CM | POA: Diagnosis not present

## 2020-06-14 DIAGNOSIS — Z510 Encounter for antineoplastic radiation therapy: Secondary | ICD-10-CM | POA: Diagnosis not present

## 2020-06-15 ENCOUNTER — Inpatient Hospital Stay: Payer: BC Managed Care – PPO

## 2020-06-15 ENCOUNTER — Encounter: Payer: Self-pay | Admitting: Hematology

## 2020-06-15 ENCOUNTER — Other Ambulatory Visit: Payer: Self-pay

## 2020-06-15 ENCOUNTER — Ambulatory Visit: Payer: BC Managed Care – PPO

## 2020-06-15 ENCOUNTER — Ambulatory Visit
Admission: RE | Admit: 2020-06-15 | Discharge: 2020-06-15 | Disposition: A | Discharge status: Home / Self Care | Payer: BC Managed Care – PPO | Source: Ambulatory Visit | Attending: Radiation Oncology | Admitting: Radiation Oncology

## 2020-06-15 ENCOUNTER — Inpatient Hospital Stay: Payer: BC Managed Care – PPO | Attending: Hematology | Admitting: Hematology

## 2020-06-15 VITALS — BP 117/89 | HR 111 | Temp 98.7°F | Resp 17 | Wt 277.5 lb

## 2020-06-15 DIAGNOSIS — F319 Bipolar disorder, unspecified: Secondary | ICD-10-CM | POA: Insufficient documentation

## 2020-06-15 DIAGNOSIS — K589 Irritable bowel syndrome without diarrhea: Secondary | ICD-10-CM | POA: Insufficient documentation

## 2020-06-15 DIAGNOSIS — Z923 Personal history of irradiation: Secondary | ICD-10-CM | POA: Diagnosis not present

## 2020-06-15 DIAGNOSIS — D0511 Intraductal carcinoma in situ of right breast: Secondary | ICD-10-CM

## 2020-06-15 DIAGNOSIS — E039 Hypothyroidism, unspecified: Secondary | ICD-10-CM | POA: Diagnosis not present

## 2020-06-15 DIAGNOSIS — Z803 Family history of malignant neoplasm of breast: Secondary | ICD-10-CM | POA: Diagnosis not present

## 2020-06-15 DIAGNOSIS — I10 Essential (primary) hypertension: Secondary | ICD-10-CM | POA: Insufficient documentation

## 2020-06-15 DIAGNOSIS — K219 Gastro-esophageal reflux disease without esophagitis: Secondary | ICD-10-CM | POA: Insufficient documentation

## 2020-06-15 DIAGNOSIS — Z800 Family history of malignant neoplasm of digestive organs: Secondary | ICD-10-CM | POA: Diagnosis not present

## 2020-06-15 DIAGNOSIS — Z510 Encounter for antineoplastic radiation therapy: Secondary | ICD-10-CM | POA: Diagnosis not present

## 2020-06-15 DIAGNOSIS — Z79899 Other long term (current) drug therapy: Secondary | ICD-10-CM | POA: Diagnosis not present

## 2020-06-15 DIAGNOSIS — Z170 Estrogen receptor positive status [ER+]: Secondary | ICD-10-CM | POA: Insufficient documentation

## 2020-06-15 LAB — CMP (CANCER CENTER ONLY)
ALT: 26 U/L (ref 0–44)
AST: 21 U/L (ref 15–41)
Albumin: 4 g/dL (ref 3.5–5.0)
Alkaline Phosphatase: 123 U/L (ref 38–126)
Anion gap: 10 (ref 5–15)
BUN: 8 mg/dL (ref 6–20)
CO2: 22 mmol/L (ref 22–32)
Calcium: 10.1 mg/dL (ref 8.9–10.3)
Chloride: 109 mmol/L (ref 98–111)
Creatinine: 0.92 mg/dL (ref 0.44–1.00)
GFR, Estimated: >60 mL/min (ref >60)
Glucose, Bld: 99 mg/dL (ref 70–99)
Potassium: 3.9 mmol/L (ref 3.5–5.1)
Sodium: 141 mmol/L (ref 135–145)
Total Bilirubin: 0.4 mg/dL (ref 0.3–1.2)
Total Protein: 7.9 g/dL (ref 6.5–8.1)

## 2020-06-15 LAB — CBC WITH DIFFERENTIAL (CANCER CENTER ONLY)
Abs Immature Granulocytes: 0.02 10*3/uL (ref 0.00–0.07)
Basophils Absolute: 0 10*3/uL (ref 0.0–0.1)
Basophils Relative: 0 %
Eosinophils Absolute: 0.2 10*3/uL (ref 0.0–0.5)
Eosinophils Relative: 3 %
HCT: 42.8 % (ref 36.0–46.0)
Hemoglobin: 14.1 g/dL (ref 12.0–15.0)
Immature Granulocytes: 0 %
Lymphocytes Relative: 24 %
Lymphs Abs: 1.5 10*3/uL (ref 0.7–4.0)
MCH: 31.3 pg (ref 26.0–34.0)
MCHC: 32.9 g/dL (ref 30.0–36.0)
MCV: 95.1 fL (ref 80.0–100.0)
Monocytes Absolute: 0.4 10*3/uL (ref 0.1–1.0)
Monocytes Relative: 6 %
Neutro Abs: 4.1 10*3/uL (ref 1.7–7.7)
Neutrophils Relative %: 67 %
Platelet Count: 270 10*3/uL (ref 150–400)
RBC: 4.5 MIL/uL (ref 3.87–5.11)
RDW: 13.2 % (ref 11.5–15.5)
WBC Count: 6.1 10*3/uL (ref 4.0–10.5)
nRBC: 0 % (ref 0.0–0.2)

## 2020-06-16 ENCOUNTER — Encounter: Payer: Self-pay | Admitting: Radiation Oncology

## 2020-06-16 ENCOUNTER — Ambulatory Visit
Admission: RE | Admit: 2020-06-16 | Discharge: 2020-06-16 | Disposition: A | Discharge status: Home / Self Care | Payer: BC Managed Care – PPO | Source: Ambulatory Visit | Attending: Radiation Oncology | Admitting: Radiation Oncology

## 2020-06-16 DIAGNOSIS — Z170 Estrogen receptor positive status [ER+]: Secondary | ICD-10-CM | POA: Diagnosis not present

## 2020-06-16 DIAGNOSIS — Z510 Encounter for antineoplastic radiation therapy: Secondary | ICD-10-CM | POA: Diagnosis not present

## 2020-06-16 DIAGNOSIS — D0511 Intraductal carcinoma in situ of right breast: Secondary | ICD-10-CM | POA: Diagnosis not present

## 2020-06-17 ENCOUNTER — Telehealth: Payer: Self-pay | Admitting: Hematology

## 2020-06-17 ENCOUNTER — Ambulatory Visit: Payer: Self-pay | Admitting: Radiation Oncology

## 2020-06-17 NOTE — Telephone Encounter (Signed)
Scheduled follow-up appointment per 4/6 los. Patient is aware.

## 2020-07-19 ENCOUNTER — Ambulatory Visit: Payer: BC Managed Care – PPO | Attending: Radiation Oncology | Admitting: Radiation Oncology

## 2020-07-22 ENCOUNTER — Ambulatory Visit
Admission: RE | Admit: 2020-07-22 | Discharge: 2020-07-22 | Disposition: A | Discharge status: Home / Self Care | Payer: BC Managed Care – PPO | Source: Ambulatory Visit | Attending: Radiation Oncology | Admitting: Radiation Oncology

## 2020-07-22 ENCOUNTER — Other Ambulatory Visit: Payer: Self-pay

## 2020-07-22 DIAGNOSIS — Z170 Estrogen receptor positive status [ER+]: Secondary | ICD-10-CM | POA: Diagnosis not present

## 2020-07-22 DIAGNOSIS — Z79899 Other long term (current) drug therapy: Secondary | ICD-10-CM | POA: Insufficient documentation

## 2020-07-22 DIAGNOSIS — D0511 Intraductal carcinoma in situ of right breast: Secondary | ICD-10-CM

## 2020-07-22 DIAGNOSIS — R5383 Other fatigue: Secondary | ICD-10-CM | POA: Insufficient documentation

## 2020-07-22 DIAGNOSIS — Z923 Personal history of irradiation: Secondary | ICD-10-CM | POA: Diagnosis not present

## 2020-07-22 NOTE — Progress Notes (Signed)
Ms. Gemmill presents today for follow-up after completing radiation to her right breast on 06/16/2020  Pain: Reports occasional pains to her right breast, but states they resolve on their own and are not bothersome enough to take OTC pain medication Skin: Denies any blistering or peeling, and states it is starting to lighten from hyperpigmentation ROM: Denies any issues or concerns Lymphedema: Denies any issues or concerns MedOnc F/U: 12/15/2020 Survivorship Care Plan with Regan Rakers Burton-NP Other issues of note: Recently returned from a trip to [place]. Still dealing with fatigue, but reports it's slowly improving. She is motivated to loose weight and increase her physical activity. Overall she reports she feels good, and is thankful for the care she received during treatment  Pt reports Yes No Comments  Tamoxifen []  [] atient still deciding   Letrozole []  [x]    Anastrazole []  [x]    Mammogram []  Date: TBD [] 

## 2020-07-25 ENCOUNTER — Encounter: Payer: Self-pay | Admitting: Radiation Oncology

## 2020-07-25 ENCOUNTER — Other Ambulatory Visit: Payer: Self-pay

## 2020-07-25 DIAGNOSIS — D0511 Intraductal carcinoma in situ of right breast: Secondary | ICD-10-CM

## 2020-07-25 NOTE — Progress Notes (Signed)
Radiation Oncology         [phone] ________________________________  Name: Anita Joyce MRN: [id number]  Date: 07/22/2020  DOB: 1965-07-01  Follow-Up Visit Note  Outpatient  CC: Anita Ada, MD  Anita Ada, MD  Diagnosis and Prior Radiotherapy:    ICD-10-CM   1. Ductal carcinoma in situ (DCIS) of right breast  D05.11    Cancer Staging Ductal carcinoma in situ (DCIS) of right breast Staging form: Breast, AJCC 8th Edition - Clinical stage from 03/30/2020: Stage 0 (cTis (DCIS), cN0, cM0, G3, ER+, PR-, HER2: Not Assessed) - Unsigned Stage prefix: Initial diagnosis Laterality: Right Staged by: Pathologist and managing physician Stage used in treatment planning: Yes National guidelines used in treatment planning: Yes Type of national guideline used in treatment planning: NCCN - Pathologic stage from 04/14/2020: pTis (DCIS), G3, ER+, PR-, HER2: Unknown - Signed by Anita Merle, MD on 06/15/2020 Stage prefix: Initial diagnosis Histologic grading system: 3 grade system Residual tumor (R): R0 - None  Chief Complaint: healing well, occasional pains in breast  Narrative: [name] presents today for follow-up after completing radiation to her right breast on 06/16/2020  Pain: Reports occasional pains to her right breast, but states they resolve on their own and are not bothersome enough to take OTC pain medication Skin: Denies any blistering or peeling, and states it is starting to lighten from hyperpigmentation ROM: Denies any issues or concerns Lymphedema: Denies any issues or concerns MedOnc F/U: 12/15/2020 Survivorship Care Plan with Anita Joyce-NP Other issues of note: Recently returned from a trip to [place]. Still dealing with fatigue, but reports it's slowly improving. She is motivated to loose weight and increase her physical activity. Overall she reports she feels good, and is thankful for the care she received during treatment  She has medical oncology about  options for weight loss and is anticipating a referral to [facility] loss program but has not heard from anyone  Pt reports Yes No Comments  Tamoxifen _0  _1 patient still deciding   Letrozole _2  _3    Anastrazole _4  _5    Mammogram _6  Date: TBD _7       ALLERGIES:  has No Known Allergies.  Meds: Current Outpatient Medications  Medication Sig Dispense Refill  . amLODipine (NORVASC) 10 MG tablet Take 10 mg by mouth at bedtime.    Marland Kitchen b complex vitamins tablet Take 1 tablet by mouth at bedtime.    . dicyclomine (BENTYL) 10 MG capsule Take 10 mg by mouth 2 (two) times daily.    . folic acid (FOLVITE) 884 MCG tablet Take 800 mcg by mouth at bedtime.    Marland Kitchen levothyroxine (SYNTHROID, LEVOTHROID) 150 MCG tablet Take 150 mcg by mouth daily before breakfast.    . lithium carbonate (LITHOBID) 300 MG CR tablet Take 300-1,200 mg by mouth See admin instructions. Take four tablets (1200 mg) by mouth every morning and one tablet (300 mg) at night    . metoprolol succinate (TOPROL-XL) 50 MG 24 hr tablet Take 50 mg by mouth at bedtime.    . Multiple Vitamin (MULTIVITAMIN WITH MINERALS) TABS tablet Take 1 tablet by mouth at bedtime.    Marland Kitchen omeprazole (PRILOSEC OTC) 20 MG tablet Take 20 mg by mouth at bedtime. Take one tablet (20 mg) by mouth every other night    . oxyCODONE (OXY IR/ROXICODONE) 5 MG immediate release tablet Take 1 tablet (5 mg total) by mouth every 6 (six) hours as needed for severe pain. 15 tablet 0   No  current facility-administered medications for this encounter.    Physical Findings: The patient is in no acute distress. Patient is alert and oriented.  vitals were not taken for this visit. .    She has residual hyperpigmentation and dryness over her right breast but this skin is healing satisfactorily   Lab Findings: Lab Results  Component Value Date   WBC 6.1 06/15/2020   HGB 14.1 06/15/2020   HCT 42.8 06/15/2020   MCV 95.1 06/15/2020   PLT 270 06/15/2020     Radiographic Findings: No results found.  Impression/Plan: Healing well from radiotherapy to the breast tissue.  Continue skin care with topical Vitamin E Oil and / or lotion for at least 2 more months for further healing.  I encouraged her to continue with yearly mammography as appropriate (for intact breast tissue) and followup with medical oncology. I will see her back on an as-needed basis. I have encouraged her to call if she has any issues or concerns in the future. I wished her the very best.  I contacted Dr. Burr Joyce about the patient's interest in the [facility] weight loss program to make sure that her referral went through.  On date of service, in total, I spent 15 minutes on this encounter. Patient was seen in person.  _____________________________________   Anita Gibson, MD

## 2020-07-29 DIAGNOSIS — Z13820 Encounter for screening for osteoporosis: Secondary | ICD-10-CM | POA: Diagnosis not present

## 2020-07-29 DIAGNOSIS — E049 Nontoxic goiter, unspecified: Secondary | ICD-10-CM | POA: Diagnosis not present

## 2020-07-29 DIAGNOSIS — E210 Primary hyperparathyroidism: Secondary | ICD-10-CM | POA: Diagnosis not present

## 2020-07-29 DIAGNOSIS — I10 Essential (primary) hypertension: Secondary | ICD-10-CM | POA: Diagnosis not present

## 2020-07-29 DIAGNOSIS — E039 Hypothyroidism, unspecified: Secondary | ICD-10-CM | POA: Diagnosis not present

## 2020-08-03 ENCOUNTER — Other Ambulatory Visit (HOSPITAL_COMMUNITY): Payer: Self-pay | Admitting: Endocrinology

## 2020-08-03 DIAGNOSIS — E213 Hyperparathyroidism, unspecified: Secondary | ICD-10-CM

## 2020-08-05 NOTE — Progress Notes (Signed)
  Patient Name: Anita Joyce MRN: [id number] DOB: March 31, 1965 Referring Physician: Carol Ada (Profile Not Attached) Date of Service: 06/16/2020 [facility], [place]                                                        End Of Treatment Note  Diagnoses: D05.11-Intraductal carcinoma in situ of right breast  Cancer Staging: Cancer Staging Ductal carcinoma in situ (DCIS) of right breast Staging form: Breast, AJCC 8th Edition - Clinical stage from 03/30/2020: Stage 0 (cTis (DCIS), cN0, cM0, G3, ER+, PR-, HER2: Not Assessed) - Unsigned Stage prefix: Initial diagnosis Laterality: Right Staged by: Pathologist and managing physician Stage used in treatment planning: Yes National guidelines used in treatment planning: Yes Type of national guideline used in treatment planning: NCCN - Pathologic stage from 04/14/2020: pTis (DCIS), G3, ER+, PR-, HER2: Unknown - Signed by Truitt Merle, MD on 06/15/2020 Stage prefix: Initial diagnosis Histologic grading system: 3 grade system Residual tumor (R): R0 - None  Intent: Curative  Radiation Treatment Dates: 05/23/2020 through 06/16/2020 Site Technique Total Dose (Gy) Dose per Fx (Gy) Completed Fx Beam Energies  Breast, Right: Breast_Rt 3D 40.05/40.05 2.67 15/15 6X, 10X  Breast, Right: Breast_Rt_Bst specialPort 7.5/7.5 2.5 3/3 9E, 12E   Narrative: The patient tolerated radiation therapy relatively well.   Plan: The patient will follow-up with radiation oncology in 34mo.  -----------------------------------  Eppie Gibson, MD

## 2020-09-16 ENCOUNTER — Encounter (HOSPITAL_COMMUNITY)
Admission: RE | Admit: 2020-09-16 | Discharge: 2020-09-16 | Disposition: A | Discharge status: Home / Self Care | Payer: BC Managed Care – PPO | Source: Ambulatory Visit | Attending: Endocrinology | Admitting: Endocrinology

## 2020-09-16 ENCOUNTER — Other Ambulatory Visit: Payer: Self-pay

## 2020-09-16 DIAGNOSIS — E213 Hyperparathyroidism, unspecified: Secondary | ICD-10-CM | POA: Diagnosis not present

## 2020-09-16 MED ORDER — TECHNETIUM TC 99M SESTAMIBI GENERIC - CARDIOLITE
26.5000 | Freq: Once | INTRAVENOUS | Status: AC | PRN
  Administered 2020-09-16: 26.5 via INTRAVENOUS

## 2020-10-19 ENCOUNTER — Other Ambulatory Visit: Payer: Self-pay | Admitting: Adult Health

## 2020-10-19 DIAGNOSIS — D0511 Intraductal carcinoma in situ of right breast: Secondary | ICD-10-CM

## 2020-11-02 DIAGNOSIS — M545 Low back pain, unspecified: Secondary | ICD-10-CM | POA: Diagnosis not present

## 2020-11-02 DIAGNOSIS — N390 Urinary tract infection, site not specified: Secondary | ICD-10-CM | POA: Diagnosis not present

## 2020-12-08 ENCOUNTER — Telehealth: Payer: Self-pay | Admitting: *Deleted

## 2020-12-15 ENCOUNTER — Other Ambulatory Visit: Payer: Self-pay

## 2020-12-15 ENCOUNTER — Telehealth: Payer: Self-pay | Admitting: Hematology

## 2020-12-15 ENCOUNTER — Inpatient Hospital Stay: Payer: BC Managed Care – PPO | Attending: Nurse Practitioner | Admitting: Nurse Practitioner

## 2020-12-15 DIAGNOSIS — C50911 Malignant neoplasm of unspecified site of right female breast: Secondary | ICD-10-CM

## 2020-12-15 DIAGNOSIS — D0511 Intraductal carcinoma in situ of right breast: Secondary | ICD-10-CM

## 2020-12-15 DIAGNOSIS — Z170 Estrogen receptor positive status [ER+]: Secondary | ICD-10-CM

## 2020-12-15 NOTE — Progress Notes (Signed)
CLINIC: Survivorship  Patient Care Team: Carol Ada, MD as PCP - General (Family Medicine) Mauro Kaufmann, RN as Oncology Nurse Navigator Rockwell Germany, RN as Oncology Nurse Navigator Truitt Merle, MD as Consulting Physician (Hematology) Eppie Gibson, MD as Attending Physician (Radiation Oncology) Alla Feeling, NP as Nurse Practitioner (Nurse Practitioner) Stark Klein, MD as Consulting Physician (General Surgery)   I connected with Sudie Grumbling. Komperda on 12/15/20 at 12:30 PM EDT by caregility video software and verified that I am speaking with the correct person using two identifiers.  I discussed the limitations, risks, security and privacy concerns of performing an evaluation and management service by telephone and the availability of in person appointments. I also discussed with the patient that there may be a patient responsible charge related to this service. The patient expressed understanding and agreed to proceed.   BRIEF ONCOLOGIC HISTORY:  Oncology History Overview Note  Cancer Staging Ductal carcinoma in situ (DCIS) of right breast Staging form: Breast, AJCC 8th Edition - Clinical stage from 03/30/2020: Stage 0 (cTis (DCIS), cN0, cM0, G3, ER+, PR-, HER2: Not Assessed) - Unsigned Stage prefix: Initial diagnosis Laterality: Right Staged by: Pathologist and managing physician Stage used in treatment planning: Yes National guidelines used in treatment planning: Yes Type of national guideline used in treatment planning: NCCN - Pathologic stage from 04/14/2020: pTis (DCIS), G3, ER+, PR-, HER2: Unknown - Signed by Truitt Merle, MD on 06/15/2020 Stage prefix: Initial diagnosis Histologic grading system: 3 grade system Residual tumor (R): R0 - None    Ductal carcinoma in situ (DCIS) of right breast  02/29/2020 Mammogram   Right breast mammogram  IMPRESSION The 1.5cm linear branching pleomorphic calcifications in the upper outer aspect middle depth right breast, 3cmfn, are  suspicious of malignancy. A stereotactic biopsy is recommended.     03/16/2020 Initial Biopsy   Diagnosis Breast, right, needle core biopsy - HIGH GRADE DUCTAL CARCINOMA IN SITU WITH CENTRAL NECROSIS AND CALCIFICATIONS. Microscopic Comment Ancillary studies will be reported separately. Results reported to [facility] [employer] on 03/17/2020. Dr. Melina Copa reviewed.    ER- 30% positive, moderately staining intensity  PR- 0% negative    03/23/2020 Initial Diagnosis   Ductal carcinoma in situ (DCIS) of right breast   03/30/2020 Genetic Testing   77 gene panel and 8 gene panel with [facility] on 03/30/20 was complete and negative for pathogenetic mutations.    04/14/2020 Surgery   RIGHT BREAST LUMPECTOMY WITH RADIOACTIVE SEED LOCALIZATION by Dr Barry Dienes     04/14/2020 Pathology Results   FINAL MICROSCOPIC DIAGNOSIS:   A. BREAST, RIGHT W/SEED, LUMPECTOMY:  - High grade ductal carcinoma in situ with necrosis, spanning 1 cm.  - Margins are negative for carcinoma.  - Biopsy site.  - See oncology table.    04/14/2020 Cancer Staging   Staging form: Breast, AJCC 8th Edition - Pathologic stage from 04/14/2020: pTis (DCIS), G3, ER+, PR-, HER2: Unknown - Signed by Truitt Merle, MD on 06/15/2020 Stage prefix: Initial diagnosis Histologic grading system: 3 grade system Residual tumor (R): R0 - None   05/23/2020 - 06/16/2020 Radiation Therapy   Adjuvant Radiation with Dr. Isidore Moos 05/23/20-06/16/20     INTERVAL HISTORY:  Ms. Damas is very busy at work and had actually tried to cancel today's appointment.  She is doing very well with no concerns and is grateful for the excellent care she received.  She is still undecided on tamoxifen.  ONCOLOGY TREATMENT TEAM:  1. Surgeon:  Dr. Barry Dienes at [facility]  Surgery 2. Medical Oncologist: Dr. Burr Medico 3. Radiation Oncologist: Dr. Isidore Moos    PAST MEDICAL/SURGICAL HISTORY:  Past Medical History:  Diagnosis Date   Bipolar 1 disorder ([place])    Cancer ([place])     breast   Depression    Family history of breast cancer    Family history of colon cancer    Family history of melanoma    GERD (gastroesophageal reflux disease)    Hypercalcemia    Hypertension    Hypothyroidism    IBS (irritable bowel syndrome)    Past Surgical History:  Procedure Laterality Date   BREAST LUMPECTOMY WITH RADIOACTIVE SEED LOCALIZATION Right 04/14/2020   Procedure: RIGHT BREAST LUMPECTOMY WITH RADIOACTIVE SEED LOCALIZATION;  Surgeon: Stark Klein, MD;  Location: [facility];  Service: General;  Laterality: Right;  RNFA   CHOLECYSTECTOMY     COLONOSCOPY     several   OVARY SURGERY Left    laser adhesions     PLACEMENT OF BREAST IMPLANTS  1982   REPLACEMENT TOTAL KNEE Left    UPPER GI ENDOSCOPY       ALLERGIES:  No Known Allergies   CURRENT MEDICATIONS:  Outpatient Encounter Medications as of 12/15/2020  Medication Sig   amLODipine (NORVASC) 10 MG tablet Take 10 mg by mouth at bedtime.   b complex vitamins tablet Take 1 tablet by mouth at bedtime.   dicyclomine (BENTYL) 10 MG capsule Take 10 mg by mouth 2 (two) times daily.   folic acid (FOLVITE) 324 MCG tablet Take 800 mcg by mouth at bedtime.   levothyroxine (SYNTHROID, LEVOTHROID) 150 MCG tablet Take 150 mcg by mouth daily before breakfast.   lithium carbonate (LITHOBID) 300 MG CR tablet Take 300-1,200 mg by mouth See admin instructions. Take four tablets (1200 mg) by mouth every morning and one tablet (300 mg) at night   metoprolol succinate (TOPROL-XL) 50 MG 24 hr tablet Take 50 mg by mouth at bedtime.   Multiple Vitamin (MULTIVITAMIN WITH MINERALS) TABS tablet Take 1 tablet by mouth at bedtime.   omeprazole (PRILOSEC OTC) 20 MG tablet Take 20 mg by mouth at bedtime. Take one tablet (20 mg) by mouth every other night   oxyCODONE (OXY IR/ROXICODONE) 5 MG immediate release tablet Take 1 tablet (5 mg total) by mouth every 6 (six) hours as needed for severe pain.   No facility-administered encounter medications  on file as of 12/15/2020.     ONCOLOGIC FAMILY HISTORY:  Family History  Problem Relation Age of Onset   Colon cancer Mother 73   Breast cancer Mother 70   Von Willebrand disease Father    Breast cancer Maternal Grandmother        dx early 92s   Colon cancer Sister 45   Heart attack Maternal Uncle    Melanoma Maternal Grandfather    Pneumonia Paternal Grandfather      GENETIC COUNSELING/TESTING: Yes, negative 03/30/2020  SOCIAL HISTORY:  Social History   Socioeconomic History   Marital status: Single    Spouse name: Not on file   Number of children: 0   Years of education: Not on file   Highest education level: Not on file  Occupational History   Occupation: Government social research officer   Tobacco Use   Smoking status: Never   Smokeless tobacco: Never  Vaping Use   Vaping Use: Never used  Substance and Sexual Activity   Alcohol use: Not Currently    Comment: social   Drug use: Not Currently   Sexual activity:  Not on file  Other Topics Concern   Not on file  Social History Narrative   Not on file   Social Determinants of Health   Financial Resource Strain: Not on file  Food Insecurity: Not on file  Transportation Needs: Not on file  Physical Activity: Not on file  Stress: Not on file  Social Connections: Not on file  Intimate Partner Violence: Not on file     OBSERVATIONS/OBJECTIVE:   LABORATORY DATA:  None for this visit.  DIAGNOSTIC IMAGING:  None for this visit.      ASSESSMENT AND PLAN:  Ms.. Simcoe is a pleasant 55 y.o. female with Stage 0 right breast DCIS ER+/PR-, diagnosed in 03/2020, treated with lumpectomy and adjuvant radiation therapy. She is still considering anti-estrogen therapy.    1. Stage 0 right breast DCIS:  Ms. Stooksbury has recovered well from definitive treatment for breast cancer. She will follow-up with surgery in 02/2021 and medical oncologist, Dr. Burr Medico in 05/2021. She plans to retire in 04/2021 and will decide about tamoxifen at that  time. I will send her treatment summary electronically for her to review.  A copy of this summary, along with a letter will be sent to the patient's primary care provider via In Basket message after today's visit.    2. Bone health:  Given Ms. Watchman's age and postmenopausal status, she should be screened for bone demineralization.  I have placed an order for DEXA to be done with mammogram in 02/2021.   3. Cancer screening:  Due to Ms. Brookshire's history and her age, she should receive screening for skin cancers, colon cancer, and gynecologic cancers.  The information and recommendations are listed on the patient's comprehensive care plan/treatment summary and were reviewed in detail with the patient.    4. Health maintenance and wellness promotion: age appropriate health maintenance information was provided in her treatment summary that was sent electronically to her MyChart account.    Follow up instructions:    -Mammogram and DEXA due in 02/2021 -Follow up with surgery in December 2022 -Follow up at [facility] in 05/2021   Orders Placed This Encounter  Procedures   MM DIAG BREAST TOMO BILATERAL    Standing Status:   Future    Standing Expiration Date:   12/15/2021    Order Specific Question:   Reason for Exam (SYMPTOM  OR DIAGNOSIS REQUIRED)    Answer:   h/o DCIS 03/2020    Order Specific Question:   Is the patient pregnant?    Answer:   No    Order Specific Question:   Preferred imaging location?    Answer:   [facility]   DG Bone Density    Standing Status:   Future    Standing Expiration Date:   12/15/2021    Order Specific Question:   Reason for Exam (SYMPTOM  OR DIAGNOSIS REQUIRED)    Answer:   screening    Order Specific Question:   Is the patient pregnant?    Answer:   No    Order Specific Question:   Preferred imaging location?    Answer:   [facility]     -She is welcome to return back to the Survivorship Clinic at any time; no additional follow-up  needed at this time.  -Consider referral back to survivorship as a long-term survivor for continued surveillance The patient was provided an opportunity to ask questions and all were answered. The patient agreed with the plan and demonstrated an understanding  of the instructions.  The patient was advised to call back or seek an in-person evaluation if she has any new/worsening concerns.   There is no charge for today's visit given that we did not review the care plan in detail and pt had actually tried to cancel earlier today.   Alla Feeling, NP

## 2020-12-15 NOTE — Telephone Encounter (Signed)
Scheduled follow-up appointment per 10/6 los. Patient is aware. 

## 2021-01-16 DIAGNOSIS — Z23 Encounter for immunization: Secondary | ICD-10-CM | POA: Diagnosis not present

## 2021-01-16 DIAGNOSIS — E049 Nontoxic goiter, unspecified: Secondary | ICD-10-CM | POA: Diagnosis not present

## 2021-01-16 DIAGNOSIS — E039 Hypothyroidism, unspecified: Secondary | ICD-10-CM | POA: Diagnosis not present

## 2021-01-16 DIAGNOSIS — I10 Essential (primary) hypertension: Secondary | ICD-10-CM | POA: Diagnosis not present

## 2021-01-16 DIAGNOSIS — E210 Primary hyperparathyroidism: Secondary | ICD-10-CM | POA: Diagnosis not present

## 2021-04-06 DIAGNOSIS — R300 Dysuria: Secondary | ICD-10-CM | POA: Diagnosis not present

## 2021-04-06 DIAGNOSIS — R3989 Other symptoms and signs involving the genitourinary system: Secondary | ICD-10-CM | POA: Diagnosis not present

## 2021-04-06 DIAGNOSIS — N3000 Acute cystitis without hematuria: Secondary | ICD-10-CM | POA: Diagnosis not present

## 2021-04-27 DIAGNOSIS — Z853 Personal history of malignant neoplasm of breast: Secondary | ICD-10-CM | POA: Diagnosis not present

## 2021-04-27 DIAGNOSIS — R928 Other abnormal and inconclusive findings on diagnostic imaging of breast: Secondary | ICD-10-CM | POA: Diagnosis not present

## 2021-05-15 ENCOUNTER — Inpatient Hospital Stay: Payer: BC Managed Care – PPO

## 2021-05-15 ENCOUNTER — Inpatient Hospital Stay: Payer: BC Managed Care – PPO | Attending: Hematology | Admitting: Hematology

## 2021-05-15 DIAGNOSIS — D0511 Intraductal carcinoma in situ of right breast: Secondary | ICD-10-CM

## 2021-06-05 DIAGNOSIS — M545 Low back pain, unspecified: Secondary | ICD-10-CM | POA: Diagnosis not present

## 2021-06-05 DIAGNOSIS — N390 Urinary tract infection, site not specified: Secondary | ICD-10-CM | POA: Diagnosis not present

## 2021-06-15 DIAGNOSIS — R109 Unspecified abdominal pain: Secondary | ICD-10-CM | POA: Diagnosis not present

## 2021-06-15 DIAGNOSIS — R1032 Left lower quadrant pain: Secondary | ICD-10-CM | POA: Diagnosis not present

## 2021-06-15 DIAGNOSIS — R300 Dysuria: Secondary | ICD-10-CM | POA: Diagnosis not present

## 2021-07-04 DIAGNOSIS — E878 Other disorders of electrolyte and fluid balance, not elsewhere classified: Secondary | ICD-10-CM | POA: Diagnosis not present

## 2021-07-04 DIAGNOSIS — E213 Hyperparathyroidism, unspecified: Secondary | ICD-10-CM | POA: Diagnosis not present

## 2021-07-04 DIAGNOSIS — R739 Hyperglycemia, unspecified: Secondary | ICD-10-CM | POA: Diagnosis not present

## 2021-07-04 DIAGNOSIS — R5383 Other fatigue: Secondary | ICD-10-CM | POA: Diagnosis not present

## 2021-07-18 ENCOUNTER — Encounter (HOSPITAL_COMMUNITY): Payer: Self-pay

## 2021-07-27 DIAGNOSIS — E039 Hypothyroidism, unspecified: Secondary | ICD-10-CM | POA: Diagnosis not present

## 2021-07-27 DIAGNOSIS — E7800 Pure hypercholesterolemia, unspecified: Secondary | ICD-10-CM | POA: Diagnosis not present

## 2021-07-27 DIAGNOSIS — I10 Essential (primary) hypertension: Secondary | ICD-10-CM | POA: Diagnosis not present

## 2021-07-27 DIAGNOSIS — Z124 Encounter for screening for malignant neoplasm of cervix: Secondary | ICD-10-CM | POA: Diagnosis not present

## 2021-07-27 DIAGNOSIS — Z0000 Encounter for general adult medical examination without abnormal findings: Secondary | ICD-10-CM | POA: Diagnosis not present

## 2021-07-27 DIAGNOSIS — F3174 Bipolar disorder, in full remission, most recent episode manic: Secondary | ICD-10-CM | POA: Diagnosis not present

## 2021-08-25 DIAGNOSIS — E210 Primary hyperparathyroidism: Secondary | ICD-10-CM | POA: Diagnosis not present

## 2021-08-25 DIAGNOSIS — E039 Hypothyroidism, unspecified: Secondary | ICD-10-CM | POA: Diagnosis not present

## 2021-09-01 DIAGNOSIS — E210 Primary hyperparathyroidism: Secondary | ICD-10-CM | POA: Diagnosis not present

## 2021-09-08 DIAGNOSIS — E210 Primary hyperparathyroidism: Secondary | ICD-10-CM | POA: Diagnosis not present

## 2021-11-07 DIAGNOSIS — M5412 Radiculopathy, cervical region: Secondary | ICD-10-CM | POA: Diagnosis not present

## 2021-11-14 DIAGNOSIS — E8889 Other specified metabolic disorders: Secondary | ICD-10-CM | POA: Diagnosis not present

## 2021-11-14 DIAGNOSIS — Z79899 Other long term (current) drug therapy: Secondary | ICD-10-CM | POA: Diagnosis not present

## 2021-11-14 DIAGNOSIS — I10 Essential (primary) hypertension: Secondary | ICD-10-CM | POA: Diagnosis not present

## 2021-11-14 DIAGNOSIS — E559 Vitamin D deficiency, unspecified: Secondary | ICD-10-CM | POA: Diagnosis not present

## 2021-11-14 DIAGNOSIS — E213 Hyperparathyroidism, unspecified: Secondary | ICD-10-CM | POA: Diagnosis not present

## 2021-11-14 DIAGNOSIS — E063 Autoimmune thyroiditis: Secondary | ICD-10-CM | POA: Diagnosis not present

## 2021-11-14 DIAGNOSIS — Z9189 Other specified personal risk factors, not elsewhere classified: Secondary | ICD-10-CM | POA: Diagnosis not present

## 2021-11-14 DIAGNOSIS — R7301 Impaired fasting glucose: Secondary | ICD-10-CM | POA: Diagnosis not present

## 2021-11-27 DIAGNOSIS — E210 Primary hyperparathyroidism: Secondary | ICD-10-CM | POA: Diagnosis not present

## 2021-11-27 DIAGNOSIS — E039 Hypothyroidism, unspecified: Secondary | ICD-10-CM | POA: Diagnosis not present

## 2021-12-11 DIAGNOSIS — E210 Primary hyperparathyroidism: Secondary | ICD-10-CM | POA: Diagnosis not present

## 2021-12-11 DIAGNOSIS — I10 Essential (primary) hypertension: Secondary | ICD-10-CM | POA: Diagnosis not present

## 2021-12-11 DIAGNOSIS — E039 Hypothyroidism, unspecified: Secondary | ICD-10-CM | POA: Diagnosis not present

## 2021-12-26 DIAGNOSIS — E063 Autoimmune thyroiditis: Secondary | ICD-10-CM | POA: Diagnosis not present

## 2021-12-26 DIAGNOSIS — I10 Essential (primary) hypertension: Secondary | ICD-10-CM | POA: Diagnosis not present

## 2021-12-26 DIAGNOSIS — E213 Hyperparathyroidism, unspecified: Secondary | ICD-10-CM | POA: Diagnosis not present

## 2021-12-26 DIAGNOSIS — E039 Hypothyroidism, unspecified: Secondary | ICD-10-CM | POA: Diagnosis not present

## 2022-01-16 DIAGNOSIS — E210 Primary hyperparathyroidism: Secondary | ICD-10-CM | POA: Diagnosis not present

## 2022-01-17 DIAGNOSIS — E063 Autoimmune thyroiditis: Secondary | ICD-10-CM | POA: Diagnosis not present

## 2022-01-17 DIAGNOSIS — F3181 Bipolar II disorder: Secondary | ICD-10-CM | POA: Diagnosis not present

## 2022-01-17 DIAGNOSIS — E213 Hyperparathyroidism, unspecified: Secondary | ICD-10-CM | POA: Diagnosis not present

## 2022-01-17 DIAGNOSIS — I10 Essential (primary) hypertension: Secondary | ICD-10-CM | POA: Diagnosis not present

## 2022-01-22 DIAGNOSIS — L821 Other seborrheic keratosis: Secondary | ICD-10-CM | POA: Diagnosis not present

## 2022-01-22 DIAGNOSIS — L738 Other specified follicular disorders: Secondary | ICD-10-CM | POA: Diagnosis not present

## 2022-01-22 DIAGNOSIS — D2272 Melanocytic nevi of left lower limb, including hip: Secondary | ICD-10-CM | POA: Diagnosis not present

## 2022-01-23 DIAGNOSIS — E039 Hypothyroidism, unspecified: Secondary | ICD-10-CM | POA: Diagnosis not present

## 2022-01-23 DIAGNOSIS — I10 Essential (primary) hypertension: Secondary | ICD-10-CM | POA: Diagnosis not present

## 2022-01-23 DIAGNOSIS — E559 Vitamin D deficiency, unspecified: Secondary | ICD-10-CM | POA: Diagnosis not present

## 2022-01-23 DIAGNOSIS — E210 Primary hyperparathyroidism: Secondary | ICD-10-CM | POA: Diagnosis not present

## 2022-01-25 DIAGNOSIS — M25562 Pain in left knee: Secondary | ICD-10-CM | POA: Diagnosis not present

## 2022-02-07 DIAGNOSIS — K589 Irritable bowel syndrome without diarrhea: Secondary | ICD-10-CM | POA: Diagnosis not present

## 2022-02-07 DIAGNOSIS — Z9189 Other specified personal risk factors, not elsewhere classified: Secondary | ICD-10-CM | POA: Diagnosis not present

## 2022-02-07 DIAGNOSIS — I10 Essential (primary) hypertension: Secondary | ICD-10-CM | POA: Diagnosis not present

## 2022-02-07 DIAGNOSIS — E213 Hyperparathyroidism, unspecified: Secondary | ICD-10-CM | POA: Diagnosis not present

## 2022-02-07 DIAGNOSIS — E063 Autoimmune thyroiditis: Secondary | ICD-10-CM | POA: Diagnosis not present

## 2022-02-14 DIAGNOSIS — E039 Hypothyroidism, unspecified: Secondary | ICD-10-CM | POA: Diagnosis not present

## 2022-02-14 DIAGNOSIS — E559 Vitamin D deficiency, unspecified: Secondary | ICD-10-CM | POA: Diagnosis not present

## 2022-02-14 DIAGNOSIS — E210 Primary hyperparathyroidism: Secondary | ICD-10-CM | POA: Diagnosis not present

## 2022-02-19 DIAGNOSIS — Z86010 Personal history of colonic polyps: Secondary | ICD-10-CM | POA: Diagnosis not present

## 2022-02-19 DIAGNOSIS — K589 Irritable bowel syndrome without diarrhea: Secondary | ICD-10-CM | POA: Diagnosis not present

## 2022-02-28 DIAGNOSIS — I10 Essential (primary) hypertension: Secondary | ICD-10-CM | POA: Diagnosis not present

## 2022-02-28 DIAGNOSIS — E213 Hyperparathyroidism, unspecified: Secondary | ICD-10-CM | POA: Diagnosis not present

## 2022-02-28 DIAGNOSIS — Z9189 Other specified personal risk factors, not elsewhere classified: Secondary | ICD-10-CM | POA: Diagnosis not present

## 2022-02-28 DIAGNOSIS — E063 Autoimmune thyroiditis: Secondary | ICD-10-CM | POA: Diagnosis not present

## 2022-02-28 DIAGNOSIS — E65 Localized adiposity: Secondary | ICD-10-CM | POA: Diagnosis not present

## 2022-03-07 DIAGNOSIS — I10 Essential (primary) hypertension: Secondary | ICD-10-CM | POA: Diagnosis not present

## 2022-03-07 DIAGNOSIS — F3174 Bipolar disorder, in full remission, most recent episode manic: Secondary | ICD-10-CM | POA: Diagnosis not present

## 2022-03-07 DIAGNOSIS — E039 Hypothyroidism, unspecified: Secondary | ICD-10-CM | POA: Diagnosis not present

## 2022-03-07 DIAGNOSIS — E669 Obesity, unspecified: Secondary | ICD-10-CM | POA: Diagnosis not present

## 2022-03-28 DIAGNOSIS — E65 Localized adiposity: Secondary | ICD-10-CM | POA: Diagnosis not present

## 2022-03-28 DIAGNOSIS — K580 Irritable bowel syndrome with diarrhea: Secondary | ICD-10-CM | POA: Diagnosis not present

## 2022-03-28 DIAGNOSIS — K219 Gastro-esophageal reflux disease without esophagitis: Secondary | ICD-10-CM | POA: Diagnosis not present

## 2022-04-23 DIAGNOSIS — Z6841 Body Mass Index (BMI) 40.0 and over, adult: Secondary | ICD-10-CM | POA: Diagnosis not present

## 2022-04-23 DIAGNOSIS — K580 Irritable bowel syndrome with diarrhea: Secondary | ICD-10-CM | POA: Diagnosis not present

## 2022-05-03 DIAGNOSIS — Z853 Personal history of malignant neoplasm of breast: Secondary | ICD-10-CM | POA: Diagnosis not present

## 2022-05-03 DIAGNOSIS — Z803 Family history of malignant neoplasm of breast: Secondary | ICD-10-CM | POA: Diagnosis not present

## 2022-05-16 DIAGNOSIS — Z6841 Body Mass Index (BMI) 40.0 and over, adult: Secondary | ICD-10-CM | POA: Diagnosis not present

## 2022-05-16 DIAGNOSIS — Z79899 Other long term (current) drug therapy: Secondary | ICD-10-CM | POA: Diagnosis not present

## 2022-05-16 DIAGNOSIS — K580 Irritable bowel syndrome with diarrhea: Secondary | ICD-10-CM | POA: Diagnosis not present

## 2022-05-16 DIAGNOSIS — E039 Hypothyroidism, unspecified: Secondary | ICD-10-CM | POA: Diagnosis not present

## 2022-06-05 ENCOUNTER — Encounter: Payer: Self-pay | Admitting: Behavioral Health

## 2022-06-05 ENCOUNTER — Ambulatory Visit (INDEPENDENT_AMBULATORY_CARE_PROVIDER_SITE_OTHER): Payer: BC Managed Care – PPO | Admitting: Behavioral Health

## 2022-06-05 VITALS — BP 107/80 | HR 89 | Ht 69.0 in | Wt 278.0 lb

## 2022-06-05 DIAGNOSIS — Z8659 Personal history of other mental and behavioral disorders: Secondary | ICD-10-CM

## 2022-06-05 NOTE — Progress Notes (Signed)
[facility] MD/PA/NP Initial Note  06/05/2022 4:47 PM [name]  MRN:  [id number]  Chief Complaint:  Chief Complaint   Anxiety; Establish Care; Patient Education; Medication Problem     HPI:  "Anita Joyce", 57 year old female present to this office for initial visit and to establish care. She says that she was diagnosed with bipolar disorder at the age of 88 or 77. Says she had 4 very severe manic episodes occurring every year for 4 years during that time frame. She since has been on lithium for 30 plus years and has not experienced any more mania during that time. She has experience para thyroid problems and had tumors on three glands. Says then she started to experience increase in her lithium levels outside normal range and risk toxicity. PCP expressed that her case was becoming complicated and recommended she establish with psychiatrist.  Says she has lithium level draw next week. Last check she was at 1.2 or higher. She since has reduced her lithium to two 300 mg tablet in the morning and and one tablet in the evening for total of 1200 mg daily hoping her levels will return to therapeutic range. She questions whether she needs the medication anymore with it being 30 plus years on the medication but she is extremely fearful of relapse. She admits to anxiety about weaning or coming off the medication. She agrees to stay on 1200 mg for now until next serum blood draw.  She denies any depression currently and says anxiety is 2/10. She denies any mania, no psychosis, no auditory or visual hallucinations. Denies SI or Hi.  She does not remember any other psychiatric  medications trials from the past at this time. Will obtain list.     Visit Diagnosis:    ICD-10-CM   1. Hx of bipolar disorder  Z86.59       Past Psychiatric History: Bipolar   Past Medical History:  Past Medical History:  Diagnosis Date   Bipolar 1 disorder ([place])    Cancer ([place])    breast   Depression    Family history  of breast cancer    Family history of colon cancer    Family history of melanoma    Fatigue    GERD (gastroesophageal reflux disease)    Headache    Hypercalcemia    Hypertension    Hypothyroidism    IBS (irritable bowel syndrome)     Past Surgical History:  Procedure Laterality Date   BREAST LUMPECTOMY WITH RADIOACTIVE SEED LOCALIZATION Right 04/14/2020   Procedure: RIGHT BREAST LUMPECTOMY WITH RADIOACTIVE SEED LOCALIZATION;  Surgeon: Stark Klein, MD;  Location: [facility];  Service: General;  Laterality: Right;  RNFA   CHOLECYSTECTOMY     COLONOSCOPY     several   OVARY SURGERY Left    laser adhesions     PLACEMENT OF BREAST IMPLANTS  1982   REPLACEMENT TOTAL KNEE Left    UPPER GI ENDOSCOPY      Family Psychiatric History: see chart  Family History:  Family History  Problem Relation Age of Onset   Bipolar disorder Mother    Colon cancer Mother 54   Breast cancer Mother 89   Von Willebrand disease Father    Colon cancer Sister 49   Heart attack Maternal Uncle    Melanoma Maternal Grandfather    Breast cancer Maternal Grandmother        dx early 53s   Pneumonia Paternal Grandfather     Social History:  Social History   Socioeconomic History   Marital status: Single    Spouse name: Not on file   Number of children: 0   Years of education: 16   Highest education level: Bachelor's degree (e.g., BA, AB, BS)  Occupational History   Occupation: Government social research officer   Tobacco Use   Smoking status: Never   Smokeless tobacco: Never  Vaping Use   Vaping Use: Never used  Substance and Sexual Activity   Alcohol use: Not Currently    Comment: social   Drug use: Not Currently   Sexual activity: Not Currently    Birth control/protection: Post-menopausal  Other Topics Concern   Not on file  Social History Narrative   Lives in   Social Determinants of Health   Financial Resource Strain: Not on file  Food Insecurity: Not on file  Transportation Needs: Not on file   Physical Activity: Not on file  Stress: Not on file  Social Connections: Not on file    Allergies: No Known Allergies  Metabolic Disorder Labs: No results found for: "HGBA1C", "MPG" No results found for: "PROLACTIN" No results found for: "CHOL", "TRIG", "HDL", "CHOLHDL", "VLDL", "LDLCALC" No results found for: "TSH"  Therapeutic Level Labs: Lab Results  Component Value Date   LITHIUM 1.05 02/08/2018   No results found for: "VALPROATE" No results found for: "CBMZ"  Current Medications: Current Outpatient Medications  Medication Sig Dispense Refill   amLODipine (NORVASC) 10 MG tablet Take 10 mg by mouth at bedtime.     b complex vitamins tablet Take 1 tablet by mouth at bedtime.     dicyclomine (BENTYL) 10 MG capsule Take 10 mg by mouth 2 (two) times daily.     folic acid (FOLVITE) [id number] MCG tablet Take 800 mcg by mouth at bedtime.     levothyroxine (SYNTHROID, LEVOTHROID) 150 MCG tablet Take 150 mcg by mouth daily before breakfast.     lithium carbonate (LITHOBID) 300 MG CR tablet Take 300-1,200 mg by mouth See admin instructions. Take four tablets (1200 mg) by mouth every morning and one tablet (300 mg) at night     metoprolol succinate (TOPROL-XL) 50 MG 24 hr tablet Take 50 mg by mouth at bedtime.     Multiple Vitamin (MULTIVITAMIN WITH MINERALS) TABS tablet Take 1 tablet by mouth at bedtime.     omeprazole (PRILOSEC OTC) 20 MG tablet Take 20 mg by mouth at bedtime. Take one tablet (20 mg) by mouth every other night     oxyCODONE (OXY IR/ROXICODONE) 5 MG immediate release tablet Take 1 tablet (5 mg total) by mouth every 6 (six) hours as needed for severe pain. 15 tablet 0   No current facility-administered medications for this visit.    Medication Side Effects: none  Orders placed this visit:  No orders of the defined types were placed in this encounter.   Psychiatric Specialty Exam:  Review of Systems  Constitutional: Negative.   Allergic/Immunologic: Negative.    Neurological: Negative.   Psychiatric/Behavioral:  The patient is nervous/anxious.     Blood pressure 107/80, pulse 89, height 5\' 9"  (1.753 m), weight 278 lb (126.1 kg).Body mass index is 41.05 kg/m.  General Appearance: Casual, Neat, and Well Groomed  Eye Contact:  Good  Speech:  Clear and Coherent and Talkative  Volume:  Normal  Mood:  NA  Affect:  Appropriate  Thought Process:  Coherent  Orientation:  Full (Time, Place, and Person)  Thought Content: Logical   Suicidal Thoughts:  No  Homicidal  Thoughts:  No  Memory:  WNL  Judgement:  Good  Insight:  Good  Psychomotor Activity:  Normal  Concentration:  Concentration: Good  Recall:  Good  Fund of Knowledge: Good  Language: Good  Assets:  Desire for Improvement  ADL's:  Intact  Cognition: WNL  Prognosis:  Good   Screenings:  PHQ2-9    [place] Office Visit from 06/05/2022 in [place]  PHQ-2 Total Score 0      Flowsheet Row Admission (Discharged) from 04/14/2020 in [facility] No Risk       Receiving Psychotherapy: No   Treatment Plan/Recommendations:   Greater than 50% of  60 min face to face time with patient was spent on counseling and coordination of care. We discussed her long hx of bipolar since the age of 46 or 69. We discussed her previous treatment and plan of care with her PCP.  She has had the same PCP for over 30 years.  We discussed her concerns over stopping or weaning off of lithium.  Discussed risk of relapse.  We discussed her goals for care in this office. We agreed to: Will reduce lithium to 600 mg in the a.m. and 600 mg in the p.m. Patient will follow-up next week with PCP as agreed for lithium level blood draw Follow-up in this office in approximately 4 weeks to reassess Provided emergency contact information Will report worsening symptoms or side effects promptly Patient did not need any refills at this time Reviewed  [place], NP

## 2022-06-06 ENCOUNTER — Ambulatory Visit: Payer: BC Managed Care – PPO | Admitting: Behavioral Health

## 2022-06-13 DIAGNOSIS — E039 Hypothyroidism, unspecified: Secondary | ICD-10-CM | POA: Diagnosis not present

## 2022-06-13 DIAGNOSIS — F3174 Bipolar disorder, in full remission, most recent episode manic: Secondary | ICD-10-CM | POA: Diagnosis not present

## 2022-06-13 DIAGNOSIS — Z79899 Other long term (current) drug therapy: Secondary | ICD-10-CM | POA: Diagnosis not present

## 2022-06-13 DIAGNOSIS — K580 Irritable bowel syndrome with diarrhea: Secondary | ICD-10-CM | POA: Diagnosis not present

## 2022-06-13 DIAGNOSIS — E559 Vitamin D deficiency, unspecified: Secondary | ICD-10-CM | POA: Diagnosis not present

## 2022-06-13 DIAGNOSIS — I10 Essential (primary) hypertension: Secondary | ICD-10-CM | POA: Diagnosis not present

## 2022-07-04 ENCOUNTER — Ambulatory Visit: Payer: BC Managed Care – PPO | Admitting: Behavioral Health

## 2022-07-04 DIAGNOSIS — K580 Irritable bowel syndrome with diarrhea: Secondary | ICD-10-CM | POA: Diagnosis not present

## 2022-07-04 DIAGNOSIS — I10 Essential (primary) hypertension: Secondary | ICD-10-CM | POA: Diagnosis not present

## 2022-07-04 DIAGNOSIS — E039 Hypothyroidism, unspecified: Secondary | ICD-10-CM | POA: Diagnosis not present

## 2022-07-04 DIAGNOSIS — F3174 Bipolar disorder, in full remission, most recent episode manic: Secondary | ICD-10-CM | POA: Diagnosis not present

## 2022-07-12 ENCOUNTER — Ambulatory Visit (INDEPENDENT_AMBULATORY_CARE_PROVIDER_SITE_OTHER): Payer: BC Managed Care – PPO | Admitting: Behavioral Health

## 2022-07-12 ENCOUNTER — Encounter: Payer: Self-pay | Admitting: Behavioral Health

## 2022-07-12 DIAGNOSIS — Z8659 Personal history of other mental and behavioral disorders: Secondary | ICD-10-CM | POA: Diagnosis not present

## 2022-07-12 NOTE — Progress Notes (Signed)
[facility] Med Check  Patient ID: Anita Joyce,  MRN: [id number]  PCP: Merri Brunette, MD  Date of Evaluation: 07/12/2022 Time spent:30 minutes  Chief Complaint:  Chief Complaint   Anxiety; Depression; Follow-up; Patient Education; Medication Problem     HISTORY/CURRENT STATUS: HPI "Anita Joyce", 57 year old female present to this office for follow up and medication management. Still concerned about what to do with her lithium. She does not want to make any medication adjustment today. Says her lithium levels are now within therapeutic range since reducing. She since has reduced her lithium to two 300 mg tablet in the morning and and one tablet in the evening for total of 1200 mg daily. She agrees to stay on 1200 mg for now until next serum blood draw in June. She wants to f/u with this office again in July.   She denies any depression currently and says anxiety is 2/10. She denies any mania, no psychosis, no auditory or visual hallucinations. Denies SI or Hi.   She does not remember any other psychiatric  medications trials from the past at this time. Will obtain list.     Individual Medical History/ Review of Systems: Changes? :No   Allergies: Patient has no known allergies.  Current Medications:  Current Outpatient Medications:    amLODipine (NORVASC) 10 MG tablet, Take 10 mg by mouth at bedtime., Disp: , Rfl:    b complex vitamins tablet, Take 1 tablet by mouth at bedtime., Disp: , Rfl:    dicyclomine (BENTYL) 10 MG capsule, Take 10 mg by mouth 2 (two) times daily., Disp: , Rfl:    folic acid (FOLVITE) 800 MCG tablet, Take 800 mcg by mouth at bedtime., Disp: , Rfl:    levothyroxine (SYNTHROID, LEVOTHROID) 150 MCG tablet, Take 150 mcg by mouth daily before breakfast., Disp: , Rfl:    lithium carbonate (LITHOBID) 300 MG CR tablet, Take 300-1,200 mg by mouth See admin instructions. Take four tablets (1200 mg) by mouth every morning and one tablet (300 mg) at night, Disp: , Rfl:     metoprolol succinate (TOPROL-XL) 50 MG 24 hr tablet, Take 50 mg by mouth at bedtime., Disp: , Rfl:    Multiple Vitamin (MULTIVITAMIN WITH MINERALS) TABS tablet, Take 1 tablet by mouth at bedtime., Disp: , Rfl:    omeprazole (PRILOSEC OTC) 20 MG tablet, Take 20 mg by mouth at bedtime. Take one tablet (20 mg) by mouth every other night, Disp: , Rfl:    oxyCODONE (OXY IR/ROXICODONE) 5 MG immediate release tablet, Take 1 tablet (5 mg total) by mouth every 6 (six) hours as needed for severe pain., Disp: 15 tablet, Rfl: 0 Medication Side Effects: none  Family Medical/ Social History: Changes? No  MENTAL HEALTH EXAM:  There were no vitals taken for this visit.There is no height or weight on file to calculate BMI.  General Appearance: Casual, Neat, and Well Groomed  Eye Contact:  Good  Speech:  Clear and Coherent  Volume:  Normal  Mood:  Anxious and Depressed  Affect:  Appropriate  Thought Process:  Coherent  Orientation:  Full (Time, Place, and Person)  Thought Content: Logical   Suicidal Thoughts:  No  Homicidal Thoughts:  No  Memory:  WNL  Judgement:  Good  Insight:  Good  Psychomotor Activity:  Normal  Concentration:  Concentration: Good  Recall:  Good  Fund of Knowledge: Good  Language: Good  Assets:  Desire for Improvement  ADL's:  Intact  Cognition: WNL  Prognosis:  Good    DIAGNOSES:    ICD-10-CM   1. Hx of bipolar disorder  Z86.59       Receiving Psychotherapy: No    RECOMMENDATIONS:   Greater than 50% of  30  min face to face time with patient was spent on counseling and coordination of care. We continued to discussed her concerns over stopping or weaning off of lithium. Lithium levels are now within therapeutic range at 0.7.  Discussed risk of relapse.  We discussed her goals for care in this office. We agreed to: To continue lithium to 600 mg in the a.m. and 600 mg in the p.m. Pt is f/u with PCP and will have another lab draw with her in June. She wants to  follow back up with this office in July. Still trying to make decision about weaning off Lithium.  Follow-up in this office in approximately 12  weeks to reassess Provided emergency contact information Will report worsening symptoms or side effects promptly Patient did not need any refills at this time Reviewed PDMP   Joan Flores, NP

## 2022-07-24 DIAGNOSIS — E039 Hypothyroidism, unspecified: Secondary | ICD-10-CM | POA: Diagnosis not present

## 2022-07-24 DIAGNOSIS — E210 Primary hyperparathyroidism: Secondary | ICD-10-CM | POA: Diagnosis not present

## 2022-07-31 DIAGNOSIS — I10 Essential (primary) hypertension: Secondary | ICD-10-CM | POA: Diagnosis not present

## 2022-07-31 DIAGNOSIS — E039 Hypothyroidism, unspecified: Secondary | ICD-10-CM | POA: Diagnosis not present

## 2022-07-31 DIAGNOSIS — E049 Nontoxic goiter, unspecified: Secondary | ICD-10-CM | POA: Diagnosis not present

## 2022-07-31 DIAGNOSIS — E210 Primary hyperparathyroidism: Secondary | ICD-10-CM | POA: Diagnosis not present

## 2022-09-11 DIAGNOSIS — Z79899 Other long term (current) drug therapy: Secondary | ICD-10-CM | POA: Diagnosis not present

## 2022-09-11 DIAGNOSIS — Z0000 Encounter for general adult medical examination without abnormal findings: Secondary | ICD-10-CM | POA: Diagnosis not present

## 2022-09-11 DIAGNOSIS — F3174 Bipolar disorder, in full remission, most recent episode manic: Secondary | ICD-10-CM | POA: Diagnosis not present

## 2022-09-11 DIAGNOSIS — Z23 Encounter for immunization: Secondary | ICD-10-CM | POA: Diagnosis not present

## 2022-09-11 DIAGNOSIS — E7800 Pure hypercholesterolemia, unspecified: Secondary | ICD-10-CM | POA: Diagnosis not present

## 2022-09-11 DIAGNOSIS — R7301 Impaired fasting glucose: Secondary | ICD-10-CM | POA: Diagnosis not present

## 2022-09-11 DIAGNOSIS — G4701 Insomnia due to medical condition: Secondary | ICD-10-CM | POA: Diagnosis not present

## 2022-09-24 DIAGNOSIS — Z9189 Other specified personal risk factors, not elsewhere classified: Secondary | ICD-10-CM | POA: Diagnosis not present

## 2022-09-24 DIAGNOSIS — I10 Essential (primary) hypertension: Secondary | ICD-10-CM | POA: Diagnosis not present

## 2022-09-24 DIAGNOSIS — F3174 Bipolar disorder, in full remission, most recent episode manic: Secondary | ICD-10-CM | POA: Diagnosis not present

## 2022-09-24 DIAGNOSIS — K580 Irritable bowel syndrome with diarrhea: Secondary | ICD-10-CM | POA: Diagnosis not present

## 2022-09-24 DIAGNOSIS — E039 Hypothyroidism, unspecified: Secondary | ICD-10-CM | POA: Diagnosis not present

## 2022-10-08 ENCOUNTER — Ambulatory Visit
Admission: EM | Admit: 2022-10-08 | Discharge: 2022-10-08 | Disposition: A | Discharge status: Home / Self Care | Payer: BC Managed Care – PPO | Attending: Internal Medicine | Admitting: Internal Medicine

## 2022-10-08 DIAGNOSIS — L03221 Cellulitis of neck: Secondary | ICD-10-CM

## 2022-10-08 MED ORDER — AMOXICILLIN-POT CLAVULANATE 875-125 MG PO TABS: 1.0000 | ORAL_TABLET | Freq: Two times a day (BID) | ORAL | 0 refills | Status: DC

## 2022-10-08 NOTE — Discharge Instructions (Signed)
Take Augmentin twice a day for the next 7 days to treat skin infection of the left neck.  You may apply warm compresses to the neck to help improve blood flow to the area and soothe skin.  If your symptoms fail to improve in the next 24 to 48 hours while taking the antibiotic or if you develop any worsening swelling, redness, drainage, or fever/chills, please return to urgent care for reevaluation.  Otherwise, you may follow-up with your primary care provider in the next 5 to 7 days.

## 2022-10-08 NOTE — ED Provider Notes (Signed)
[facility]    CSN: [id number] Arrival date & time: 10/08/22  1119      History   Chief Complaint Chief Complaint  Patient presents with   Insect Bite    HPI Anita Joyce is a 57 y.o. female.   Patient presents to urgent care for evaluation of redness, swelling, "burning sensation" and tenderness to the left neck that started 4 to 5 days ago.  She woke up in the middle of the night and felt a "bump" to the left neck.  Bump was initially skin toned with a little bit of underlying soft tissue swelling.  The next day, bump became more swollen, red, and irritated in appearance.  She believes there was some warmth present to the lesion but is unsure.  Lesion began to itch mildly so she has been applying hydrocortisone cream to the area without much relief of the redness and swelling.  No drainage from the lesion, sore throat, ear pain, neck pain, dizziness, fever, chills, body aches, N/V, cough, or recent trauma/injury to the left neck.  No difficulty swallowing or changes in voice sounds.  No trismus.  Denies recent use of new personal hygiene products or intake of new foods.  No recent antibiotic/steroid use.  She has only been using over the outer hydrocortisone cream for symptoms without much relief.     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      Home Medications    Prior to Admission medications   Medication Sig Start Date End Date Taking? Authorizing Provider  amoxicillin-clavulanate (AUGMENTIN) 875-125 MG tablet Take 1 tablet by mouth every 12 (twelve) hours. 10/08/22  Yes Carlisle Beers, FNP  amLODipine (NORVASC) 10 MG tablet Take 10 mg by mouth at bedtime. 12/11/17   [provider]  b complex vitamins tablet Take 1 tablet by mouth at bedtime.    [provider]  dicyclomine (BENTYL) 10 MG capsule Take 10 mg by mouth 2 (two) times daily. 01/03/18   [provider]  folic acid (FOLVITE) 800 MCG tablet Take 800 mcg  by mouth at bedtime.    [provider]  levothyroxine (SYNTHROID, LEVOTHROID) 150 MCG tablet Take 150 mcg by mouth daily before breakfast.    [provider]  lithium carbonate (LITHOBID) 300 MG CR tablet Take 300-1,200 mg by mouth See admin instructions. Take four tablets (1200 mg) by mouth every morning and one tablet (300 mg) at night 01/27/18   [provider]  metoprolol succinate (TOPROL-XL) 50 MG 24 hr tablet Take 50 mg by mouth at bedtime. 02/18/20   [provider]  Multiple Vitamin (MULTIVITAMIN WITH MINERALS) TABS tablet Take 1 tablet by mouth at bedtime.    [provider]  omeprazole (PRILOSEC OTC) 20 MG tablet Take 20 mg by mouth at bedtime. Take one tablet (20 mg) by mouth every other night    [provider]  oxyCODONE  (OXY IR/ROXICODONE) 5 MG immediate release tablet Take 1 tablet (5 mg total) by mouth every 6 (six) hours as needed for severe pain. 04/14/20   Almond Lint, MD    Family History Family History  Problem Relation Age of Onset   Bipolar disorder Mother    Colon cancer Mother 110   Breast cancer Mother 37   Von Willebrand disease Father    Colon cancer Sister 23   Heart attack Maternal Uncle    Melanoma Maternal Grandfather    Breast cancer Maternal Grandmother        dx early 50s   Pneumonia Paternal Grandfather     Social History Social History   Tobacco Use   Smoking status: Never   Smokeless tobacco: Never  Vaping Use   Vaping status: Never Used  Substance Use Topics   Alcohol use: Not Currently    Comment: social   Drug use: Not Currently     Allergies   Patient has no known allergies.   Review of Systems Review of Systems Per HPI  Physical Exam Triage Vital Signs ED Triage Vitals  Encounter Vitals Group     BP 10/08/22 1142 132/88     Systolic BP Percentile --      Diastolic BP Percentile --      Pulse Rate 10/08/22 1142 77     Resp 10/08/22 1142 20     Temp 10/08/22 1142 98 F (36.7 C)     Temp Source 10/08/22 1142 Oral     SpO2 10/08/22 1142 98 %     Weight --      Height --      Head Circumference --      Peak Flow --      Pain Score 10/08/22 1143 2     Pain Loc --      Pain Education --      Exclude from Growth Chart --    No data found.  Updated Vital Signs BP 132/88 (BP Location: Left Arm)   Pulse 77   Temp 98 F (36.7 C) (Oral)   Resp 20   LMP  (LMP Unknown)   SpO2 98%   Visual Acuity Right Eye Distance:   Left Eye Distance:   Bilateral Distance:    Right Eye Near:   Left Eye Near:    Bilateral Near:     Physical Exam Vitals and nursing note reviewed.  Constitutional:      Appearance: She is not ill-appearing or toxic-appearing.  HENT:     Head: Normocephalic and atraumatic.     Right Ear: Hearing, tympanic membrane, ear  canal and external ear  normal.     Left Ear: Hearing, tympanic membrane, ear canal and external ear normal.     Nose: Nose normal.     Mouth/Throat:     Lips: Pink.     Mouth: Mucous membranes are moist. No injury.     Tongue: No lesions. Tongue does not deviate from midline.     Palate: No mass and lesions.     Pharynx: Oropharynx is clear. Uvula midline. No pharyngeal swelling, oropharyngeal exudate, posterior oropharyngeal erythema or uvula swelling.     Tonsils: No tonsillar exudate or tonsillar abscesses.  Eyes:     General: Lids are normal. Vision grossly intact. Gaze aligned appropriately.     Extraocular Movements: Extraocular movements intact.     Conjunctiva/sclera: Conjunctivae normal.  Neck:      Comments: There is a 3 cm in diameter area of erythema and mild warmth with minimal underlying soft tissue swelling present to the left neck as seen in image below.  Left-sided cervical lymphadenopathy present.  Maintaining secretions without difficulty. Pulmonary:     Effort: Pulmonary effort is normal.  Musculoskeletal:     Cervical back: Full passive range of motion without pain, normal range of motion and neck supple. Erythema and tenderness present. No edema, signs of trauma, rigidity, torticollis or crepitus. No pain with movement, spinous process tenderness or muscular tenderness. Normal range of motion.  Lymphadenopathy:     Head:     Right side of head: No submental adenopathy.     Left side of head: No submental adenopathy.     Cervical: Cervical adenopathy present.     Right cervical: No superficial or deep cervical adenopathy.    Left cervical: Superficial cervical adenopathy present.  Skin:    General: Skin is warm and dry.     Capillary Refill: Capillary refill takes less than 2 seconds.     Findings: No rash.  Neurological:     General: No focal deficit present.     Mental Status: She is alert and oriented to person, place, and time. Mental status is at baseline.      Cranial Nerves: No dysarthria or facial asymmetry.  Psychiatric:        Mood and Affect: Mood normal.        Speech: Speech normal.        Behavior: Behavior normal.        Thought Content: Thought content normal.        Judgment: Judgment normal.      UC Treatments / Results  Labs (all labs ordered are listed, but only abnormal results are displayed) Labs Reviewed - No data to display  EKG   Radiology No results found.  Procedures Procedures (including critical care time)  Medications Ordered in UC Medications - No data to display  Initial Impression / Assessment and Plan / UC Course  I have reviewed the triage vital signs and the nursing notes.  Pertinent labs & imaging results that were available during my care of the patient were reviewed by me and considered in my medical decision making (see chart for details).   1.  Cellulitis of neck Lesion appears to be infected, will manage this is a cellulitis of the left neck with Augmentin twice daily for 7 days.  Lesion has not changed very much in the last 24 to 48 hours.  No red flag signs or symptoms indicating need for referral to the emergency department for further workup/evaluation of deep soft tissue neck infection.  Strict return precautions have been discussed.  If symptoms fail to improve on antibiotic in the next 24 to 48 hours, advised patient to return to urgent care for reevaluation.  Counseled patient on potential for adverse effects with medications prescribed/recommended today, strict ER and return-to-clinic precautions discussed, patient verbalized understanding.    Final Clinical Impressions(s) / UC Diagnoses   Final diagnoses:  Cellulitis of neck     Discharge Instructions      Take Augmentin twice a day for the next 7 days to treat skin infection of the left neck.  You may apply warm compresses to the neck to help improve blood flow to the area and soothe skin.  If your symptoms fail to  improve in the next 24 to 48 hours while taking the antibiotic or if you develop any worsening swelling, redness, drainage, or fever/chills, please return to urgent care for reevaluation.  Otherwise, you may follow-up with your primary care provider in the next 5 to 7 days.     ED Prescriptions     Medication Sig Dispense Auth. Provider   amoxicillin-clavulanate (AUGMENTIN) 875-125 MG tablet Take 1 tablet by mouth every 12 (twelve) hours. 14 tablet Carlisle Beers, FNP      PDMP not reviewed this encounter.   Carlisle Beers, [place] 10/08/22 1225

## 2022-10-08 NOTE — ED Triage Notes (Signed)
Pt c/o redness/swelling to left side of neck-feels started as an insect bite 4-5 days ago-using hydrocortisone cream w/o relief-NAD-steady gait

## 2022-10-18 ENCOUNTER — Encounter: Payer: Self-pay | Admitting: Behavioral Health

## 2022-10-18 ENCOUNTER — Ambulatory Visit (INDEPENDENT_AMBULATORY_CARE_PROVIDER_SITE_OTHER): Payer: BC Managed Care – PPO | Admitting: Behavioral Health

## 2022-10-18 DIAGNOSIS — Z8659 Personal history of other mental and behavioral disorders: Secondary | ICD-10-CM | POA: Diagnosis not present

## 2022-10-18 MED ORDER — LITHIUM CARBONATE ER 300 MG PO TBCR: 300.0000 mg | EXTENDED_RELEASE_TABLET | ORAL | 1 refills | Status: DC

## 2022-10-18 NOTE — Progress Notes (Signed)
[facility]  Patient ID: Anita Joyce,  MRN: [id number]  PCP: Merri Brunette, MD  Date of Evaluation: 10/18/2022 Time spent:30 minutes  Chief Complaint:  Chief Complaint   Follow-up; Patient Education; Medication Refill; Advice Only     HISTORY/CURRENT STATUS: HPI  "Anita Joyce", 57 year old female present to this office for follow up and medication management. No change this visit. Still concerned about what to do with her lithium. She does not want to make any medication adjustment today. Says her lithium levels are now within therapeutic range since reducing. She since has reduced her lithium to two 300 mg tablet in the morning and and one tablet in the evening for total of 1200 mg daily. Recent lithium lab draw on July 2nd was 0.6. Kidney function improved. Pt will provide copy of labs. She denies any depression currently and says anxiety is 2/10. She denies any mania, no psychosis, no auditory or visual hallucinations. Denies SI or Hi.   She does not remember any other psychiatric  medications trials from the past at this time. Will obtain list.      Individual Medical History/ Review of Systems: Changes? :No   Allergies: Patient has no known allergies.  Current Medications:  Current Outpatient Medications:    amLODipine (NORVASC) 10 MG tablet, Take 10 mg by mouth at bedtime., Disp: , Rfl:    amoxicillin-clavulanate (AUGMENTIN) 875-125 MG tablet, Take 1 tablet by mouth every 12 (twelve) hours., Disp: 14 tablet, Rfl: 0   b complex vitamins tablet, Take 1 tablet by mouth at bedtime., Disp: , Rfl:    dicyclomine (BENTYL) 10 MG capsule, Take 10 mg by mouth 2 (two) times daily., Disp: , Rfl:    folic acid (FOLVITE) 800 MCG tablet, Take 800 mcg by mouth at bedtime., Disp: , Rfl:    levothyroxine (SYNTHROID, LEVOTHROID) 150 MCG tablet, Take 150 mcg by mouth daily before breakfast., Disp: , Rfl:    lithium carbonate (LITHOBID) 300 MG ER tablet, Take 1-4 tablets  (300-1,200 mg total) by mouth See admin instructions. Take four tablets (1200 mg) by mouth every morning and one tablet (300 mg) at night, Disp: 360 tablet, Rfl: 1   metoprolol succinate (TOPROL-XL) 50 MG 24 hr tablet, Take 50 mg by mouth at bedtime., Disp: , Rfl:    Multiple Vitamin (MULTIVITAMIN WITH MINERALS) TABS tablet, Take 1 tablet by mouth at bedtime., Disp: , Rfl:    omeprazole (PRILOSEC OTC) 20 MG tablet, Take 20 mg by mouth at bedtime. Take one tablet (20 mg) by mouth every other night, Disp: , Rfl:    oxyCODONE (OXY IR/ROXICODONE) 5 MG immediate release tablet, Take 1 tablet (5 mg total) by mouth every 6 (six) hours as needed for severe pain., Disp: 15 tablet, Rfl: 0 Medication Side Effects: none  Family Medical/ Social History: Changes? No  MENTAL HEALTH EXAM:  There were no vitals taken for this visit.There is no height or weight on file to calculate BMI.  General Appearance: Casual, Neat, and Well Groomed  Eye Contact:  Good  Speech:  Clear and Coherent  Volume:  Normal  Mood:  NA  Affect:  Appropriate  Thought Process:  Coherent  Orientation:  Full (Time, Place, and Person)  Thought Content: Logical   Suicidal Thoughts:  No  Homicidal Thoughts:  No  Memory:  WNL  Judgement:  Good  Insight:  Good  Psychomotor Activity:  Normal  Concentration:  Concentration: Good  Recall:  Good  Fund of Knowledge: Good  Language: Good  Assets:  Desire for Improvement  ADL's:  Intact  Cognition: WNL  Prognosis:  Good    DIAGNOSES:    ICD-10-CM   1. Hx of bipolar disorder  Z86.59 lithium carbonate (LITHOBID) 300 MG ER tablet      Receiving Psychotherapy: No     RECOMMENDATIONS:   Greater than 50% of  30  min face to face time with patient was spent on counseling and coordination of care. We continued to discussed her concerns over stopping or weaning off of lithium. Lithium levels are now within therapeutic range at 0.6. Kidney function normal.  Reinforced risk of  relapse.  We discussed her goals for care in this office. We agreed to: To continue lithium to 600 mg in the a.m. and 600 mg in the p.m. Pt is f/u with PCP and will have another lab draw with her in June. She wants to follow back up with this office in July. Still trying to make decision about weaning off Lithium.  Follow-up in this office in approximately 12  weeks to reassess Provided emergency contact information Will report worsening symptoms or side effects promptly Patient did not need any refills at this time Reviewed PDMP   Joan Flores, NP            Joan Flores, NP

## 2022-10-20 ENCOUNTER — Ambulatory Visit
Admission: EM | Admit: 2022-10-20 | Discharge: 2022-10-20 | Disposition: A | Discharge status: Home / Self Care | Payer: BC Managed Care – PPO | Attending: Internal Medicine | Admitting: Internal Medicine

## 2022-10-20 DIAGNOSIS — R300 Dysuria: Secondary | ICD-10-CM | POA: Insufficient documentation

## 2022-10-20 LAB — POCT URINALYSIS DIP (MANUAL ENTRY)
Bilirubin, UA: NEGATIVE
Blood, UA: NEGATIVE
Glucose, UA: NEGATIVE mg/dL
Ketones, POC UA: NEGATIVE mg/dL
Leukocytes, UA: NEGATIVE
Nitrite, UA: NEGATIVE
Protein Ur, POC: NEGATIVE mg/dL
Spec Grav, UA: 1.02 (ref 1.010–1.025)
Urobilinogen, UA: 0.2 E.U./dL
pH, UA: 6 (ref 5.0–8.0)

## 2022-10-20 NOTE — ED Provider Notes (Signed)
[facility]    CSN: [id number] Arrival date & time: 10/20/22  1141      History   Chief Complaint No chief complaint on file.   HPI Anita Joyce is a 57 y.o. female presents for evaluation of dysuria.  Patient reports 3 days of urinary burning.  Reports she does persistent frequency as she drinks a lot of water.  She denies any urinary urgency, fevers, nausea/vomiting, flank pain.  No vaginal discharge or STD concern.  She was recently on amoxicillin for cellulitis.  She has been taking Azo OTC.  Does report a history of reoccurring UTIs.  No other concerns at this time.  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      Home Medications    Prior to Admission medications   Medication Sig Start Date End Date Taking? Authorizing Provider   amLODipine (NORVASC) 10 MG tablet Take 10 mg by mouth at bedtime. 12/11/17   [provider]  b complex vitamins tablet Take 1 tablet by mouth at bedtime.    [provider]  dicyclomine (BENTYL) 10 MG capsule Take 10 mg by mouth 2 (two) times daily. 01/03/18   [provider]  folic acid (FOLVITE) 800 MCG tablet Take 800 mcg by mouth at bedtime.    [provider]  levothyroxine (SYNTHROID, LEVOTHROID) 150 MCG tablet Take 150 mcg by mouth daily before breakfast.    [provider]  lithium carbonate (LITHOBID) 300 MG ER tablet Take 1-4 tablets (300-1,200 mg total) by mouth See admin instructions. Take four tablets (1200 mg) by mouth every morning and one tablet (300 mg) at night 10/18/22   Avelina Laine A, NP  metoprolol succinate (TOPROL-XL) 50 MG 24 hr tablet Take 50 mg by mouth at bedtime. 02/18/20   [provider]  Multiple Vitamin (MULTIVITAMIN WITH MINERALS) TABS tablet Take 1 tablet by mouth at bedtime.    [provider]  omeprazole (PRILOSEC OTC) 20 MG tablet Take 20 mg by mouth at bedtime. Take one tablet (20 mg) by mouth every other night  [provider]  oxyCODONE (OXY IR/ROXICODONE) 5 MG immediate release tablet Take 1 tablet (5 mg total) by mouth every 6 (six) hours as needed for severe pain. 04/14/20   Almond Lint, MD    Family History Family History  Problem Relation Age of Onset   Bipolar disorder Mother    Colon cancer Mother 87   Breast cancer Mother 24   Von Willebrand disease Father    Colon cancer Sister 69   Heart attack Maternal Uncle    Melanoma Maternal Grandfather    Breast cancer Maternal Grandmother        dx early 58s   Pneumonia Paternal Grandfather     Social History Social History   Tobacco Use   Smoking status: Never   Smokeless tobacco: Never  Vaping Use   Vaping status: Never Used  Substance Use Topics   Alcohol use: Not Currently    Comment: social   Drug use: Not  Currently     Allergies   Patient has no known allergies.   Review of Systems Review of Systems  Genitourinary:  Positive for dysuria.     Physical Exam Triage Vital Signs ED Triage Vitals [10/20/22 1159]  Encounter Vitals Group     BP 123/87     Systolic BP Percentile      Diastolic BP Percentile      Pulse Rate 98     Resp 16     Temp 98.8 F (37.1 C)     Temp Source Oral     SpO2 98 %     Weight      Height      Head Circumference      Peak Flow      Pain Score      Pain Loc      Pain Education      Exclude from Growth Chart    No data found.  Updated Vital Signs BP 123/87 (BP Location: Left Arm)   Pulse 98   Temp 98.8 F (37.1 C) (Oral)   Resp 16   LMP  (LMP Unknown)   SpO2 98%   Visual Acuity Right Eye Distance:   Left Eye Distance:   Bilateral Distance:    Right Eye Near:   Left Eye Near:    Bilateral Near:     Physical Exam Vitals and nursing note reviewed.  Constitutional:      Appearance: Normal appearance.  HENT:     Head: Normocephalic and atraumatic.  Eyes:     Pupils: Pupils are equal, round, and reactive to light.  Cardiovascular:     Rate and Rhythm: Normal rate.  Pulmonary:     Effort: Pulmonary effort is normal.  Abdominal:     Tenderness: There is no right CVA tenderness or left CVA tenderness.  Skin:    General: Skin is warm and dry.  Neurological:     General: No focal deficit present.     Mental Status: She is alert and oriented to person, place, and time.  Psychiatric:        Mood and Affect: Mood normal.        Behavior: Behavior normal.      UC Treatments / Results  Labs (all labs ordered are listed, but only abnormal results are displayed) Labs Reviewed  URINE CULTURE  POCT URINALYSIS DIP (MANUAL ENTRY)    EKG   Radiology No results found.  Procedures Procedures (including critical care time)  Medications Ordered in UC Medications -  No data to display  Initial Impression / Assessment and  Plan / UC Course  I have reviewed the triage vital signs and the nursing notes.  Pertinent labs & imaging results that were available during my care of the patient were reviewed by me and considered in my medical decision making (see chart for details).     Reviewed exam and symptoms with patient.  No red flags.  UA negative for UTI, will culture given symptoms.  Advised to continue hydration and she may also continue over-the-counter Azo as needed.  PCP follow-up if symptoms do not improve.  ER precautions reviewed and patient verbalized understanding. Final Clinical Impressions(s) / UC Diagnoses   Final diagnoses:  Dysuria     Discharge Instructions      The clinic will contact you with results of the urine culture done today if it is positive.  Continue to stay hydrated and you may take over-the-counter Azo as needed.  Follow-up with your PCP if your symptoms do not improve.  Please go to the ER for any worsening symptoms.  I hope you feel better soon!   ED Prescriptions   None    PDMP not reviewed this encounter.   Radford Pax, NP 10/20/22 1213

## 2022-10-20 NOTE — Discharge Instructions (Addendum)
The clinic will contact you with results of the urine culture done today if it is positive.  Continue to stay hydrated and you may take over-the-counter Azo as needed.  Follow-up with your PCP if your symptoms do not improve.  Please go to the ER for any worsening symptoms.  I hope you feel better soon!

## 2022-10-20 NOTE — ED Triage Notes (Signed)
Pt presents to UC w/ c/o lower abd pain x3 days. Dysuria starting today. Hx requent uti

## 2022-10-21 LAB — URINE CULTURE: Culture: NO GROWTH

## 2022-11-27 DIAGNOSIS — H11821 Conjunctivochalasis, right eye: Secondary | ICD-10-CM | POA: Diagnosis not present

## 2022-11-27 DIAGNOSIS — H52223 Regular astigmatism, bilateral: Secondary | ICD-10-CM | POA: Diagnosis not present

## 2023-01-08 ENCOUNTER — Ambulatory Visit
Admission: EM | Admit: 2023-01-08 | Discharge: 2023-01-08 | Disposition: A | Discharge status: Home / Self Care | Payer: BC Managed Care – PPO | Attending: Internal Medicine | Admitting: Internal Medicine

## 2023-01-08 DIAGNOSIS — J209 Acute bronchitis, unspecified: Secondary | ICD-10-CM

## 2023-01-08 DIAGNOSIS — H6993 Unspecified Eustachian tube disorder, bilateral: Secondary | ICD-10-CM

## 2023-01-08 MED ORDER — BENZONATATE 200 MG PO CAPS: 200.0000 mg | ORAL_CAPSULE | Freq: Three times a day (TID) | ORAL | 0 refills | Status: AC | PRN

## 2023-01-08 MED ORDER — AMOXICILLIN-POT CLAVULANATE 875-125 MG PO TABS: 1.0000 | ORAL_TABLET | Freq: Two times a day (BID) | ORAL | 0 refills | Status: AC

## 2023-01-08 MED ORDER — FLUTICASONE PROPIONATE 50 MCG/ACT NA SUSP: 1.0000 | Freq: Every day | NASAL | 0 refills | Status: DC

## 2023-01-08 NOTE — ED Provider Notes (Signed)
[facility]    CSN: [id number] Arrival date & time: 01/08/23  1191      History   Chief Complaint Chief Complaint  Patient presents with   Cough   Ear Fullness    HPI Anita Joyce is a 57 y.o. female  presents for evaluation of URI symptoms for 10 days. Patient reports associated symptoms of cough, congestion, ear fullness/pain. Denies N/V/D, fevers, sore throat, body aches, shortness of breath. Patient does not have a hx of asthma. Patient does not have a history of smoking.  Reports no sick contacts.  Pt has taken DayQuil NyQuil OTC for symptoms.  Reports a negative home COVID test.  Pt has no other concerns at this time.    Cough Associated symptoms: ear pain   Ear Fullness    Past Medical History:  Diagnosis Date   Bipolar 1 disorder (HCC)    Cancer (HCC)    breast   Depression    Family history of breast cancer    Family history of colon cancer    Family history of melanoma    Fatigue    GERD (gastroesophageal reflux disease)    Headache    Hypercalcemia    Hypertension    Hypothyroidism    IBS (irritable bowel syndrome)     Patient Active Problem List   Diagnosis Date Noted   Cancer of right breast (HCC) 12/15/2020   Genetic testing 04/06/2020   Family history of breast cancer    Family history of colon cancer    Family history of melanoma    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      Home  Medications    Prior to Admission medications   Medication Sig Start Date End Date Taking? Authorizing Provider  amoxicillin-clavulanate (AUGMENTIN) 875-125 MG tablet Take 1 tablet by mouth every 12 (twelve) hours. 01/11/23  Yes Radford Pax, NP  benzonatate (TESSALON) 200 MG capsule Take 1 capsule (200 mg total) by mouth 3 (three) times daily as needed. 01/08/23  Yes Radford Pax, NP  fluticasone (FLONASE) 50 MCG/ACT nasal spray Place 1 spray into both nostrils daily. 01/08/23  Yes Radford Pax, NP  amLODipine (NORVASC) 10 MG tablet Take 10 mg by mouth at bedtime. 12/11/17   [provider]  b complex vitamins tablet Take 1 tablet by mouth at bedtime.    [provider]  dicyclomine (BENTYL) 10 MG capsule Take 10 mg by mouth 2 (two) times daily. 01/03/18   [provider]  folic acid (FOLVITE) 800 MCG tablet Take 800 mcg by mouth at bedtime.    [provider]  levothyroxine (SYNTHROID, LEVOTHROID) 150 MCG tablet Take 150 mcg by mouth daily before breakfast.    [provider]  lithium carbonate (LITHOBID) 300 MG ER tablet Take 1-4 tablets (300-1,200 mg total) by mouth See admin  instructions. Take four tablets (1200 mg) by mouth every morning and one tablet (300 mg) at night 10/18/22   Avelina Laine A, NP  metoprolol succinate (TOPROL-XL) 50 MG 24 hr tablet Take 50 mg by mouth at bedtime. 02/18/20   [provider]  Multiple Vitamin (MULTIVITAMIN WITH MINERALS) TABS tablet Take 1 tablet by mouth at bedtime.    [provider]  omeprazole (PRILOSEC OTC) 20 MG tablet Take 20 mg by mouth at bedtime. Take one tablet (20 mg) by mouth every other night    [provider]  oxyCODONE (OXY IR/ROXICODONE) 5 MG immediate release tablet Take 1 tablet (5 mg total) by mouth every 6 (six) hours as needed for severe pain. 04/14/20   Almond Lint, MD    Family History Family History  Problem Relation Age of Onset   Bipolar disorder Mother     Colon cancer Mother 78   Breast cancer Mother 92   Von Willebrand disease Father    Colon cancer Sister 24   Heart attack Maternal Uncle    Melanoma Maternal Grandfather    Breast cancer Maternal Grandmother        dx early 65s   Pneumonia Paternal Grandfather     Social History Social History   Tobacco Use   Smoking status: Never   Smokeless tobacco: Never  Vaping Use   Vaping status: Never Used  Substance Use Topics   Alcohol use: Not Currently    Comment: social   Drug use: Not Currently     Allergies   Patient has no known allergies.   Review of Systems Review of Systems  HENT:  Positive for congestion and ear pain.   Respiratory:  Positive for cough.      Physical Exam Triage Vital Signs ED Triage Vitals  Encounter Vitals Group     BP 01/08/23 1010 122/85     Systolic BP Percentile --      Diastolic BP Percentile --      Pulse Rate 01/08/23 1007 88     Resp 01/08/23 1007 17     Temp 01/08/23 1010 (!) 97.5 F (36.4 C)     Temp Source 01/08/23 1007 Oral     SpO2 01/08/23 1007 98 %     Weight --      Height --      Head Circumference --      Peak Flow --      Pain Score 01/08/23 1005 4     Pain Loc --      Pain Education --      Exclude from Growth Chart --    No data found.  Updated Vital Signs BP 122/85   Pulse 88   Temp (!) 97.5 F (36.4 C) (Oral)   Resp 17   LMP  (LMP Unknown)   SpO2 98%   Visual Acuity Right Eye Distance:   Left Eye Distance:   Bilateral Distance:    Right Eye Near:   Left Eye Near:    Bilateral Near:     Physical Exam Vitals and nursing note reviewed.  Constitutional:      General: She is not in acute distress.    Appearance: She is well-developed. She is not ill-appearing.  HENT:     Head: Normocephalic and atraumatic.     Right Ear: Ear canal normal. A middle ear effusion is present. Tympanic membrane is not erythematous.     Left Ear: Ear canal normal. A middle ear effusion  is present. Tympanic membrane  is not erythematous.     Nose: Congestion present.     Mouth/Throat:     Mouth: Mucous membranes are moist.     Pharynx: Oropharynx is clear. Uvula midline. No oropharyngeal exudate or posterior oropharyngeal erythema.     Tonsils: No tonsillar exudate or tonsillar abscesses.  Eyes:     Conjunctiva/sclera: Conjunctivae normal.     Pupils: Pupils are equal, round, and reactive to light.  Cardiovascular:     Rate and Rhythm: Normal rate and regular rhythm.     Heart sounds: Normal heart sounds.  Pulmonary:     Effort: Pulmonary effort is normal.     Breath sounds: Normal breath sounds.  Musculoskeletal:     Cervical back: Normal range of motion and neck supple.  Lymphadenopathy:     Cervical: No cervical adenopathy.  Skin:    General: Skin is warm and dry.  Neurological:     General: No focal deficit present.     Mental Status: She is alert and oriented to person, place, and time.  Psychiatric:        Mood and Affect: Mood normal.        Behavior: Behavior normal.      UC Treatments / Results  Labs (all labs ordered are listed, but only abnormal results are displayed) Labs Reviewed - No data to display  EKG   Radiology No results found.  Procedures Procedures (including critical care time)  Medications Ordered in UC Medications - No data to display  Initial Impression / Assessment and Plan / UC Course  I have reviewed the triage vital signs and the nursing notes.  Pertinent labs & imaging results that were available during my care of the patient were reviewed by me and considered in my medical decision making (see chart for details).     Reviewed exam and symptoms with patient.  No red flags.  Discussed eustachian tube dysfunction/bronchitis.  Start Flonase and [employer].  Provisional prescription for Augmentin provided with instructed not to take unless symptoms do not improve or worsen over the next 2 to 3 days and she verbalized understanding.  PCP follow-up 2  to 3 days for recheck.  ER precautions reviewed. Final Clinical Impressions(s) / UC Diagnoses   Final diagnoses:  Acute bronchitis, unspecified organism  Eustachian tube dysfunction, bilateral     Discharge Instructions      Start Flonase daily and also take Tessalon as needed for your cough.  A provisional prescription for Augmentin has been provided.  Please do not take in lesser symptoms do not improve or worsen over the next 3 days.  Lots of rest and fluids.  Please follow-up with your PCP in 2 to 3 days for recheck.  Please go to the ER for any worsening symptoms.  I hope you feel better soon!   ED Prescriptions     Medication Sig Dispense Auth. Provider   fluticasone (FLONASE) 50 MCG/ACT nasal spray Place 1 spray into both nostrils daily. 15.8 mL Radford Pax, NP   benzonatate (TESSALON) 200 MG capsule Take 1 capsule (200 mg total) by mouth 3 (three) times daily as needed. 20 capsule Radford Pax, NP   amoxicillin-clavulanate (AUGMENTIN) 875-125 MG tablet Take 1 tablet by mouth every 12 (twelve) hours. 14 tablet Radford Pax, NP      PDMP not reviewed this encounter.   Radford Pax, NP 01/08/23 1031

## 2023-01-08 NOTE — ED Triage Notes (Addendum)
Pt presents with c/o cough, chest tightness and ear fullness x 10 days. Pt states she has been vomiting when coughing. Sates she did  a covid test at home that was negative.   Pt states she has taken cough medicine, dayquil and nyquil

## 2023-01-08 NOTE — Discharge Instructions (Addendum)
Start Flonase daily and also take Tessalon as needed for your cough.  A provisional prescription for Augmentin has been provided.  Please do not take in lesser symptoms do not improve or worsen over the next 3 days.  Lots of rest and fluids.  Please follow-up with your PCP in 2 to 3 days for recheck.  Please go to the ER for any worsening symptoms.  I hope you feel better soon!

## 2023-01-24 ENCOUNTER — Ambulatory Visit
Admission: EM | Admit: 2023-01-24 | Discharge: 2023-01-24 | Disposition: A | Discharge status: Home / Self Care | Payer: BC Managed Care – PPO | Attending: Internal Medicine | Admitting: Internal Medicine

## 2023-01-24 ENCOUNTER — Encounter: Payer: Self-pay | Admitting: Emergency Medicine

## 2023-01-24 DIAGNOSIS — H6991 Unspecified Eustachian tube disorder, right ear: Secondary | ICD-10-CM

## 2023-01-24 DIAGNOSIS — E039 Hypothyroidism, unspecified: Secondary | ICD-10-CM | POA: Diagnosis not present

## 2023-01-24 DIAGNOSIS — E210 Primary hyperparathyroidism: Secondary | ICD-10-CM | POA: Diagnosis not present

## 2023-01-24 DIAGNOSIS — E559 Vitamin D deficiency, unspecified: Secondary | ICD-10-CM | POA: Diagnosis not present

## 2023-01-24 MED ORDER — PREDNISONE 10 MG PO TABS: 30.0000 mg | ORAL_TABLET | Freq: Every day | ORAL | 0 refills | Status: AC

## 2023-01-24 MED ORDER — CETIRIZINE HCL 10 MG PO TABS: 10.0000 mg | ORAL_TABLET | Freq: Every day | ORAL | 0 refills | Status: AC

## 2023-01-24 NOTE — ED Triage Notes (Signed)
Returning after a visit two weeks prior. States she got better following the prescriptions given. States the only problem she's continued to have is right ear pain.

## 2023-01-24 NOTE — ED Provider Notes (Signed)
[facility]  Note:  This document was prepared using Conservation officer, historic buildings and may include unintentional dictation errors.  MRN: [id number] DOB: 01-Jul-1965  Subjective:   Anita Joyce is a 57 y.o. female presenting for 2-week history of persistent right ear fullness.  Patient was seen 2 weeks ago and underwent a course of Augmentin, supportive care for acute bronchitis.  She has had significant improvement but her ear continues to bother her.  No overt ear pain, ear drainage.  She has been using Flonase with minimal relief.  No current facility-administered medications for this encounter.  Current Outpatient Medications:    amLODipine (NORVASC) 10 MG tablet, Take 10 mg by mouth at bedtime., Disp: , Rfl:    amoxicillin-clavulanate (AUGMENTIN) 875-125 MG tablet, Take 1 tablet by mouth every 12 (twelve) hours., Disp: 14 tablet, Rfl: 0   b complex vitamins tablet, Take 1 tablet by mouth at bedtime., Disp: , Rfl:    benzonatate (TESSALON) 200 MG capsule, Take 1 capsule (200 mg total) by mouth 3 (three) times daily as needed., Disp: 20 capsule, Rfl: 0   dicyclomine (BENTYL) 10 MG capsule, Take 10 mg by mouth 2 (two) times daily., Disp: , Rfl:    fluticasone (FLONASE) 50 MCG/ACT nasal spray, Place 1 spray into both nostrils daily., Disp: 15.8 mL, Rfl: 0   folic acid (FOLVITE) 800 MCG tablet, Take 800 mcg by mouth at bedtime., Disp: , Rfl:    levothyroxine (SYNTHROID, LEVOTHROID) 150 MCG tablet, Take 150 mcg by mouth daily before breakfast., Disp: , Rfl:    lithium carbonate (LITHOBID) 300 MG ER tablet, Take 1-4 tablets (300-1,200 mg total) by mouth See admin instructions. Take four tablets (1200 mg) by mouth every morning and one tablet (300 mg) at night, Disp: 360 tablet, Rfl: 1   metoprolol succinate (TOPROL-XL) 50 MG 24 hr tablet, Take 50 mg by mouth at bedtime., Disp: , Rfl:    Multiple Vitamin (MULTIVITAMIN WITH MINERALS) TABS tablet, Take 1 tablet by  mouth at bedtime., Disp: , Rfl:    omeprazole (PRILOSEC OTC) 20 MG tablet, Take 20 mg by mouth at bedtime. Take one tablet (20 mg) by mouth every other night, Disp: , Rfl:    oxyCODONE (OXY IR/ROXICODONE) 5 MG immediate release tablet, Take 1 tablet (5 mg total) by mouth every 6 (six) hours as needed for severe pain., Disp: 15 tablet, Rfl: 0   No Known Allergies  Past Medical History:  Diagnosis Date   Bipolar 1 disorder (HCC)    Cancer (HCC)    breast   Depression    Family history of breast cancer    Family history of colon cancer    Family history of melanoma    Fatigue    GERD (gastroesophageal reflux disease)    Headache    Hypercalcemia    Hypertension    Hypothyroidism    IBS (irritable bowel syndrome)      Past Surgical History:  Procedure Laterality Date   BREAST LUMPECTOMY WITH RADIOACTIVE SEED LOCALIZATION Right 04/14/2020   Procedure: RIGHT BREAST LUMPECTOMY WITH RADIOACTIVE SEED LOCALIZATION;  Surgeon: Almond Lint, MD;  Location: [facility] OR;  Service: General;  Laterality: Right;  RNFA   CHOLECYSTECTOMY     COLONOSCOPY     several   OVARY SURGERY Left    laser adhesions     PLACEMENT OF BREAST IMPLANTS  1982   REPLACEMENT TOTAL KNEE Left    UPPER GI ENDOSCOPY  Family History  Problem Relation Age of Onset   Bipolar disorder Mother    Colon cancer Mother 98   Breast cancer Mother 41   Von Willebrand disease Father    Colon cancer Sister 21   Heart attack Maternal Uncle    Melanoma Maternal Grandfather    Breast cancer Maternal Grandmother        dx early 34s   Pneumonia Paternal Grandfather     Social History   Tobacco Use   Smoking status: Never   Smokeless tobacco: Never  Vaping Use   Vaping status: Never Used  Substance Use Topics   Alcohol use: Not Currently    Comment: social   Drug use: Not Currently    ROS   Objective:   Vitals: BP (!) 125/92 (BP Location: Right Arm)   Pulse 81   Temp 98.6 F (37 C) (Oral)   Resp 18   LMP   (LMP Unknown)   SpO2 96%   Physical Exam Constitutional:      General: She is not in acute distress.    Appearance: Normal appearance. She is well-developed and normal weight. She is not ill-appearing, toxic-appearing or diaphoretic.  HENT:     Head: Normocephalic and atraumatic.     Right Ear: Ear canal and external ear normal. No drainage or tenderness. No middle ear effusion. There is no impacted cerumen. Tympanic membrane is not erythematous or bulging.     Left Ear: Tympanic membrane, ear canal and external ear normal. No drainage or tenderness.  No middle ear effusion. There is no impacted cerumen. Tympanic membrane is not erythematous or bulging.     Ears:     Comments: Air-fluid level of the right TM but otherwise intact and without erythema, bulging or tenderness.    Nose: No congestion or rhinorrhea.     Mouth/Throat:     Mouth: Mucous membranes are moist. No oral lesions.     Pharynx: No pharyngeal swelling, oropharyngeal exudate, posterior oropharyngeal erythema or uvula swelling.     Tonsils: No tonsillar exudate or tonsillar abscesses.  Eyes:     General: No scleral icterus.       Right eye: No discharge.        Left eye: No discharge.     Extraocular Movements: Extraocular movements intact.     Right eye: Normal extraocular motion.     Left eye: Normal extraocular motion.     Conjunctiva/sclera: Conjunctivae normal.  Cardiovascular:     Rate and Rhythm: Normal rate.  Pulmonary:     Effort: Pulmonary effort is normal.  Musculoskeletal:     Cervical back: Normal range of motion and neck supple.  Lymphadenopathy:     Cervical: No cervical adenopathy.  Skin:    General: Skin is warm and dry.  Neurological:     General: No focal deficit present.     Mental Status: She is alert and oriented to person, place, and time.  Psychiatric:        Mood and Affect: Mood normal.        Behavior: Behavior normal.     Assessment and Plan :   PDMP not reviewed this  encounter.  1. Eustachian tube dysfunction, right    Recommended a course of prednisone for 5 days at 30 mg.  Counseled extensively on the possible mental health side effects.  Patient would still like to proceed given her bothersome ear symptoms.  Recommended coupling this with Zyrtec.  Switch to pseudoephedrine if  prednisone causes adverse effects.  Counseled patient on potential for adverse effects with medications prescribed/recommended today, ER and return-to-clinic precautions discussed, patient verbalized understanding.    Wallis Bamberg, [place] 01/24/23 6213

## 2023-01-28 DIAGNOSIS — L738 Other specified follicular disorders: Secondary | ICD-10-CM | POA: Diagnosis not present

## 2023-01-28 DIAGNOSIS — D2339 Other benign neoplasm of skin of other parts of face: Secondary | ICD-10-CM | POA: Diagnosis not present

## 2023-01-28 DIAGNOSIS — L821 Other seborrheic keratosis: Secondary | ICD-10-CM | POA: Diagnosis not present

## 2023-01-28 DIAGNOSIS — L708 Other acne: Secondary | ICD-10-CM | POA: Diagnosis not present

## 2023-01-31 DIAGNOSIS — Z23 Encounter for immunization: Secondary | ICD-10-CM | POA: Diagnosis not present

## 2023-01-31 DIAGNOSIS — E210 Primary hyperparathyroidism: Secondary | ICD-10-CM | POA: Diagnosis not present

## 2023-01-31 DIAGNOSIS — E039 Hypothyroidism, unspecified: Secondary | ICD-10-CM | POA: Diagnosis not present

## 2023-01-31 DIAGNOSIS — E559 Vitamin D deficiency, unspecified: Secondary | ICD-10-CM | POA: Diagnosis not present

## 2023-01-31 DIAGNOSIS — I10 Essential (primary) hypertension: Secondary | ICD-10-CM | POA: Diagnosis not present

## 2023-02-21 ENCOUNTER — Other Ambulatory Visit: Payer: Self-pay | Admitting: Behavioral Health

## 2023-02-21 DIAGNOSIS — Z8659 Personal history of other mental and behavioral disorders: Secondary | ICD-10-CM

## 2023-02-21 NOTE — Telephone Encounter (Signed)
Rx and note dosing differs. Note says 600 mg BID. She said it was changed in April and she is to take 300 AM and 900 PM.

## 2023-02-21 NOTE — Telephone Encounter (Signed)
Patient needs to change her December appt to January. Please call.

## 2023-02-26 DIAGNOSIS — M25562 Pain in left knee: Secondary | ICD-10-CM | POA: Diagnosis not present

## 2023-02-27 ENCOUNTER — Ambulatory Visit: Payer: BC Managed Care – PPO | Admitting: Behavioral Health

## 2023-03-01 NOTE — Telephone Encounter (Signed)
Pt has appt 1/28 

## 2023-03-07 DIAGNOSIS — R262 Difficulty in walking, not elsewhere classified: Secondary | ICD-10-CM | POA: Diagnosis not present

## 2023-03-07 DIAGNOSIS — M25662 Stiffness of left knee, not elsewhere classified: Secondary | ICD-10-CM | POA: Diagnosis not present

## 2023-03-07 DIAGNOSIS — M6281 Muscle weakness (generalized): Secondary | ICD-10-CM | POA: Diagnosis not present

## 2023-03-08 ENCOUNTER — Encounter (INDEPENDENT_AMBULATORY_CARE_PROVIDER_SITE_OTHER): Payer: Self-pay

## 2023-03-08 ENCOUNTER — Ambulatory Visit (INDEPENDENT_AMBULATORY_CARE_PROVIDER_SITE_OTHER): Payer: BC Managed Care – PPO | Admitting: Otolaryngology

## 2023-03-08 VITALS — BP 119/79 | Resp 19 | Ht 69.0 in | Wt 270.0 lb

## 2023-03-08 DIAGNOSIS — H65191 Other acute nonsuppurative otitis media, right ear: Secondary | ICD-10-CM | POA: Diagnosis not present

## 2023-03-08 DIAGNOSIS — H6991 Unspecified Eustachian tube disorder, right ear: Secondary | ICD-10-CM | POA: Diagnosis not present

## 2023-03-08 MED ORDER — AZELASTINE HCL 0.1 % NA SOLN: 2.0000 | Freq: Two times a day (BID) | NASAL | 12 refills | Status: AC

## 2023-03-08 MED ORDER — FLUTICASONE PROPIONATE 50 MCG/ACT NA SUSP: 2.0000 | Freq: Two times a day (BID) | NASAL | 0 refills | Status: AC

## 2023-03-08 NOTE — Progress Notes (Addendum)
Dear Dr. Katrinka Blazing, Here is my assessment for our mutual patient, Anita Joyce. Thank you for allowing me the opportunity to care for your patient. Please do not hesitate to contact me should you have any other questions. Sincerely, Dr. Jovita Kussmaul  Otolaryngology Clinic Note Referring provider: Dr. Katrinka Blazing HPI:  Anita Joyce is a 57 y.o. female kindly referred by Dr. Katrinka Blazing for evaluation of right ear fullness and discomfort. Initial visit (02/2023): Late October she had URI Sx, cough, congestion, ear fullness. Noted ear effusion b/l and diagnosed with ETD and Bronchitis. She was treated with Flonase, Tessalon and Augmentin without improvement. She was then seen in the ED on 01/24/2023 for persistent right ear fullness, otherwise all other symptoms improved. She was noted to have right ear fluid level, and was prescribed prednisone burst, but without significant improvement.  Patient reports: right ear feels full and some pressure. No pain. Does have some right tinnitus. She reports that she used to scuba dive, and she reports that she also tried pseudoephedrine n the interim and seems to be able to equalize her pressure intermittent. Rare popping and crackling and feels "great when it pops". Feels like ear is slowly improving. Hearing is down as well Patient denies: ear pain, vertigo, drainage, tinnitus Patient additionally denies: sensitive to pressure changes Patient also denies vestibular suppressant use, ototoxic medication use.  Maybe some barotrauma during prior dives but not sure. Prior ear surgery: no No ear issues normally.  H&N Surgery: Parathyroidectomy (August 2023) [facility] Personal or FHx of bleeding dz or anesthesia difficulty: no  PMHx: Hypothyroidism, Bipolar, HTN, HLD, Breast Cancer s/p Rx (Lumpectomy, Rads)  GLP-1: no (prior ozempic) AP/AC: no  Tobacco: no. 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 Lives in [place], [place]  Independent Review of  Additional Tests or Records:  [facility] Notes (01/24/2023 and 01/08/2023):  10/29 - URI Sx, cough, congestion, ear fullness. Noted ear effusion b/l, Dx ETD and Bronchitis; Rx: Flonase, Tessalon, Augmentin; 01/24/2023 - persistent ear fullness, otherwise improved; no pain, drainage; flonsase; noted air fluid right TM;Rx: Pred 30mg  x5d, Zyrtec Dr. Katrinka Blazing (02/26/2023): persistent Sx, ref ENT 2023 CBC and CMP available in chart: reviewed, no significant leukocytosis MRI brain 2019: with attention to ears and NP - no lesions over NP, mild prominence but normal appearing adenoid bed; no mastoid effusions, no retrocochlear lesions  PMH/Meds/All/SocHx/FamHx/ROS:   Past Medical History:  Diagnosis Date   Bipolar 1 disorder (HCC)    Cancer (HCC)    breast   Depression    Family history of breast cancer    Family history of colon cancer    Family history of melanoma    Fatigue    GERD (gastroesophageal reflux disease)    Headache    Hypercalcemia    Hypertension    Hypothyroidism    IBS (irritable bowel syndrome)      Past Surgical History:  Procedure Laterality Date   BREAST LUMPECTOMY WITH RADIOACTIVE SEED LOCALIZATION Right 04/14/2020   Procedure: RIGHT BREAST LUMPECTOMY WITH RADIOACTIVE SEED LOCALIZATION;  Surgeon: Almond Lint, MD;  Location: [facility] OR;  Service: General;  Laterality: Right;  RNFA   CHOLECYSTECTOMY     COLONOSCOPY     several   OVARY SURGERY Left    laser adhesions     PLACEMENT OF BREAST IMPLANTS  1982   REPLACEMENT TOTAL KNEE Left    UPPER GI ENDOSCOPY      Family History  Problem Relation Age of Onset   Bipolar  disorder Mother    Colon cancer Mother 36   Breast cancer Mother 18   Von Willebrand disease Father    Colon cancer Sister 32   Heart attack Maternal Uncle    Melanoma Maternal Grandfather    Breast cancer Maternal Grandmother        dx early 37s   Pneumonia Paternal Grandfather      Social Connections: Not on file      Current Outpatient Medications:     amLODipine (NORVASC) 10 MG tablet, Take 10 mg by mouth at bedtime., Disp: , Rfl:    b complex vitamins tablet, Take 1 tablet by mouth at bedtime., Disp: , Rfl:    benzonatate (TESSALON) 200 MG capsule, Take 1 capsule (200 mg total) by mouth 3 (three) times daily as needed., Disp: 20 capsule, Rfl: 0   cetirizine (ZYRTEC ALLERGY) 10 MG tablet, Take 1 tablet (10 mg total) by mouth daily., Disp: 30 tablet, Rfl: 0   dicyclomine (BENTYL) 10 MG capsule, Take 10 mg by mouth 2 (two) times daily., Disp: , Rfl:    fluticasone (FLONASE) 50 MCG/ACT nasal spray, Place 1 spray into both nostrils daily., Disp: 15.8 mL, Rfl: 0   folic acid (FOLVITE) 800 MCG tablet, Take 800 mcg by mouth at bedtime., Disp: , Rfl:    levothyroxine (SYNTHROID, LEVOTHROID) 150 MCG tablet, Take 150 mcg by mouth daily before breakfast., Disp: , Rfl:    lithium carbonate (LITHOBID) 300 MG ER tablet, Take 300 QAM and 900 QPM for a total of 4 tablets daily, Disp: 360 tablet, Rfl: 0   metoprolol succinate (TOPROL-XL) 50 MG 24 hr tablet, Take 50 mg by mouth at bedtime., Disp: , Rfl:    Multiple Vitamin (MULTIVITAMIN WITH MINERALS) TABS tablet, Take 1 tablet by mouth at bedtime., Disp: , Rfl:    omeprazole (PRILOSEC OTC) 20 MG tablet, Take 20 mg by mouth at bedtime. Take one tablet (20 mg) by mouth every other night, Disp: , Rfl:    oxyCODONE (OXY IR/ROXICODONE) 5 MG immediate release tablet, Take 1 tablet (5 mg total) by mouth every 6 (six) hours as needed for severe pain., Disp: 15 tablet, Rfl: 0   amoxicillin-clavulanate (AUGMENTIN) 875-125 MG tablet, Take 1 tablet by mouth every 12 (twelve) hours. (Patient not taking: Reported on 03/08/2023), Disp: 14 tablet, Rfl: 0   predniSONE (DELTASONE) 10 MG tablet, Take 3 tablets (30 mg total) by mouth daily with breakfast. (Patient not taking: Reported on 03/08/2023), Disp: 15 tablet, Rfl: 0   Physical Exam:   BP 119/79 (BP Location: Left Arm, Patient Position: Sitting, Cuff Size: Normal)   Resp  19   Ht 5\' 9"  (1.753 m)   Wt 270 lb (122.5 kg)   LMP  (LMP Unknown)   SpO2 97%   BMI 39.87 kg/m   Salient findings:  CN II-XII intact Left EAC clear (small exostosis anterior) and TM intact with well pneumatized middle ear spaces Right EAC clear (small exostosis anterior) and TM with air fluid level. Reduced mobility on pneumatic otoscopy Weber 512: right Rinne 512: AC > BC b/l  Anterior rhinoscopy: Septum relatively midline; bilateral inferior turbinates without significant hypertrophy; given unilateral ear effusion, rigid nasal endoscopy was indicated for further evaluation of nasal cavity and nasopharynx and performed below No lesions of oral cavity/oropharynx No obviously palpable neck masses/lymphadenopathy/thyromegaly; neck incision well healed No respiratory distress or stridor  Seprately Identifiable Procedures:  PROCEDURE: Bilateral Diagnostic Rigid Nasal Endoscopy Pre-procedure diagnosis: Concern for nasal mass; unilateral ear  effusion Post-procedure diagnosis: same Indication: See pre-procedure diagnosis and physical exam above Complications: None apparent EBL: 0 mL Anesthesia: Lidocaine 4% and topical decongestant was topically sprayed in each nasal cavity  Description of Procedure:  Patient was identified. A rigid 30 degree endoscope was utilized to evaluate the sinonasal cavities, mucosa, sinus ostia and turbinates and septum.  Overall, signs of mucosal inflammation are not noted..  No mucopurulence, polyps, or masses noted.   Right Middle meatus: clear Right SE Recess: clear Left MM: clear Left SE Recess: clear No masses Left or right NP; small adenoid bed but clearly not obstructive, looks symmetric; based on comparison with 2019 MRI, appears adneoid bed still present at that point, appears to be relatively stable in size      Photodocumentation was obtained.  CPT CODE -- 09811 - Mod 25    Impression & Plans:  Ranna Knoedler is a 57 y.o. female  with:  1. Dysfunction of right eustachian tube   2. Acute MEE (middle ear effusion), right    Right effusion after URI, two months out. She has tried abx/steroids/decongestants without benefit. No significant flonase/astelin trial. No ET orifice masses and endo without purulence. We discussed options: Right tube v/s medical management. Given only two months and maybe slight improvement, will try sprays for 1 month and return for re-eval; right tube if no improvement at that point  1) Flonase BID 2) Start astelin BID 3) f/u 1 month with audiogram  See below regarding exact medications prescribed this encounter including dosages and route: No orders of the defined types were placed in this encounter.     Thank you for allowing me the opportunity to care for your patient. Please do not hesitate to contact me should you have any other questions.  Sincerely, Jovita Kussmaul, MD Otolarynoglogist (ENT), [facility] Phone: [phone] Fax: [phone]  03/08/2023, 1:08 PM   MDM:  Level 4: 99204 Complexity/Problems addressed: mod - chronic problem, now with exacerbation Data complexity: mod - independent review of imaging (MRI); review of notes, labs - Morbidity: mod  - Prescription Drug prescribed or managed: yes

## 2023-03-08 NOTE — Patient Instructions (Signed)
Use flonase two sprays each nostril twice per day Then use astelin two sprays each nostril twice per day Pop your ears few times per day

## 2023-03-12 DIAGNOSIS — M25662 Stiffness of left knee, not elsewhere classified: Secondary | ICD-10-CM | POA: Diagnosis not present

## 2023-03-12 DIAGNOSIS — M6281 Muscle weakness (generalized): Secondary | ICD-10-CM | POA: Diagnosis not present

## 2023-03-12 DIAGNOSIS — R262 Difficulty in walking, not elsewhere classified: Secondary | ICD-10-CM | POA: Diagnosis not present

## 2023-03-14 DIAGNOSIS — E210 Primary hyperparathyroidism: Secondary | ICD-10-CM | POA: Diagnosis not present

## 2023-03-14 DIAGNOSIS — E559 Vitamin D deficiency, unspecified: Secondary | ICD-10-CM | POA: Diagnosis not present

## 2023-03-14 DIAGNOSIS — E039 Hypothyroidism, unspecified: Secondary | ICD-10-CM | POA: Diagnosis not present

## 2023-03-15 DIAGNOSIS — R262 Difficulty in walking, not elsewhere classified: Secondary | ICD-10-CM | POA: Diagnosis not present

## 2023-03-15 DIAGNOSIS — M25662 Stiffness of left knee, not elsewhere classified: Secondary | ICD-10-CM | POA: Diagnosis not present

## 2023-03-15 DIAGNOSIS — M6281 Muscle weakness (generalized): Secondary | ICD-10-CM | POA: Diagnosis not present

## 2023-03-18 DIAGNOSIS — I10 Essential (primary) hypertension: Secondary | ICD-10-CM | POA: Diagnosis not present

## 2023-03-18 DIAGNOSIS — F3174 Bipolar disorder, in full remission, most recent episode manic: Secondary | ICD-10-CM | POA: Diagnosis not present

## 2023-03-19 DIAGNOSIS — M6281 Muscle weakness (generalized): Secondary | ICD-10-CM | POA: Diagnosis not present

## 2023-03-19 DIAGNOSIS — M25662 Stiffness of left knee, not elsewhere classified: Secondary | ICD-10-CM | POA: Diagnosis not present

## 2023-03-19 DIAGNOSIS — R262 Difficulty in walking, not elsewhere classified: Secondary | ICD-10-CM | POA: Diagnosis not present

## 2023-03-21 DIAGNOSIS — R262 Difficulty in walking, not elsewhere classified: Secondary | ICD-10-CM | POA: Diagnosis not present

## 2023-03-21 DIAGNOSIS — M25662 Stiffness of left knee, not elsewhere classified: Secondary | ICD-10-CM | POA: Diagnosis not present

## 2023-03-21 DIAGNOSIS — M6281 Muscle weakness (generalized): Secondary | ICD-10-CM | POA: Diagnosis not present

## 2023-03-26 DIAGNOSIS — R262 Difficulty in walking, not elsewhere classified: Secondary | ICD-10-CM | POA: Diagnosis not present

## 2023-03-26 DIAGNOSIS — M25662 Stiffness of left knee, not elsewhere classified: Secondary | ICD-10-CM | POA: Diagnosis not present

## 2023-03-26 DIAGNOSIS — M6281 Muscle weakness (generalized): Secondary | ICD-10-CM | POA: Diagnosis not present

## 2023-03-28 ENCOUNTER — Telehealth (INDEPENDENT_AMBULATORY_CARE_PROVIDER_SITE_OTHER): Payer: Self-pay | Admitting: Otolaryngology

## 2023-03-28 DIAGNOSIS — R262 Difficulty in walking, not elsewhere classified: Secondary | ICD-10-CM | POA: Diagnosis not present

## 2023-03-28 DIAGNOSIS — M6281 Muscle weakness (generalized): Secondary | ICD-10-CM | POA: Diagnosis not present

## 2023-03-28 DIAGNOSIS — M25662 Stiffness of left knee, not elsewhere classified: Secondary | ICD-10-CM | POA: Diagnosis not present

## 2023-03-28 NOTE — Telephone Encounter (Signed)
Patient called in regards to follow up appointment on 04/11/2023, she wanted to know if she still needed her audiogram even though she still has fluid in her ears and they aren't getting better. She also asked for the code for possible tubes for billing purposes so that she can check with her insurance for the cost.

## 2023-03-29 ENCOUNTER — Ambulatory Visit (INDEPENDENT_AMBULATORY_CARE_PROVIDER_SITE_OTHER): Payer: BC Managed Care – PPO

## 2023-03-29 ENCOUNTER — Ambulatory Visit (INDEPENDENT_AMBULATORY_CARE_PROVIDER_SITE_OTHER): Payer: BC Managed Care – PPO | Admitting: Audiology

## 2023-04-02 DIAGNOSIS — M25662 Stiffness of left knee, not elsewhere classified: Secondary | ICD-10-CM | POA: Diagnosis not present

## 2023-04-02 DIAGNOSIS — M6281 Muscle weakness (generalized): Secondary | ICD-10-CM | POA: Diagnosis not present

## 2023-04-02 DIAGNOSIS — R262 Difficulty in walking, not elsewhere classified: Secondary | ICD-10-CM | POA: Diagnosis not present

## 2023-04-09 ENCOUNTER — Ambulatory Visit: Payer: BC Managed Care – PPO | Admitting: Behavioral Health

## 2023-04-10 DIAGNOSIS — M25662 Stiffness of left knee, not elsewhere classified: Secondary | ICD-10-CM | POA: Diagnosis not present

## 2023-04-10 DIAGNOSIS — M6281 Muscle weakness (generalized): Secondary | ICD-10-CM | POA: Diagnosis not present

## 2023-04-10 DIAGNOSIS — R262 Difficulty in walking, not elsewhere classified: Secondary | ICD-10-CM | POA: Diagnosis not present

## 2023-04-11 ENCOUNTER — Ambulatory Visit (INDEPENDENT_AMBULATORY_CARE_PROVIDER_SITE_OTHER): Payer: BC Managed Care – PPO

## 2023-04-11 ENCOUNTER — Ambulatory Visit (INDEPENDENT_AMBULATORY_CARE_PROVIDER_SITE_OTHER): Payer: BC Managed Care – PPO | Admitting: Audiology

## 2023-04-11 DIAGNOSIS — M7062 Trochanteric bursitis, left hip: Secondary | ICD-10-CM | POA: Diagnosis not present

## 2023-05-14 ENCOUNTER — Ambulatory Visit: Payer: BC Managed Care – PPO | Admitting: Behavioral Health

## 2023-05-22 ENCOUNTER — Other Ambulatory Visit: Payer: Self-pay | Admitting: Behavioral Health

## 2023-05-22 DIAGNOSIS — Z8659 Personal history of other mental and behavioral disorders: Secondary | ICD-10-CM

## 2023-05-28 DIAGNOSIS — Z1231 Encounter for screening mammogram for malignant neoplasm of breast: Secondary | ICD-10-CM | POA: Diagnosis not present

## 2023-06-11 ENCOUNTER — Encounter: Payer: Self-pay | Admitting: Behavioral Health

## 2023-06-11 ENCOUNTER — Ambulatory Visit: Admitting: Behavioral Health

## 2023-06-11 DIAGNOSIS — Z8659 Personal history of other mental and behavioral disorders: Secondary | ICD-10-CM | POA: Diagnosis not present

## 2023-06-11 NOTE — Progress Notes (Signed)
 [facility]  Patient ID: Anita Joyce,  MRN: [id number]  PCP: Merri Brunette, MD  Date of Evaluation: 06/11/2023 Time spent:30 minutes  Chief Complaint:   HISTORY/CURRENT STATUS: HPI  "Anita Joyce", 58 year old female present to this office for follow up and medication management. No change this visit. Still concerned about what to do with her lithium. She does not want to make any medication adjustment today. Says her lithium levels are now within therapeutic range since reducing. She since has reduced her lithium to two 300 mg tablet in the morning and and one tablet in the evening for total of 900 mg daily. Recent lithium lab draw on 03/2023 and was 0.8. Kidney function improved. Pt will provide copy of labs. She denies any depression currently and says anxiety is 2/10. She denies any mania, no psychosis, no auditory or visual hallucinations. Denies SI or Hi.   She does not remember any other psychiatric  medications trials from the past at this time. Will obtain list. Individual Medical History/ Review of Systems: Changes? :No   Allergies: Patient has no known allergies.  Current Medications:  Current Outpatient Medications:    amLODipine (NORVASC) 10 MG tablet, Take 10 mg by mouth at bedtime., Disp: , Rfl:    amoxicillin-clavulanate (AUGMENTIN) 875-125 MG tablet, Take 1 tablet by mouth every 12 (twelve) hours. (Patient not taking: Reported on 03/08/2023), Disp: 14 tablet, Rfl: 0   azelastine (ASTELIN) 0.1 % nasal spray, Place 2 sprays into both nostrils 2 (two) times daily. Use in each nostril as directed, Disp: 30 mL, Rfl: 12   b complex vitamins tablet, Take 1 tablet by mouth at bedtime., Disp: , Rfl:    benzonatate (TESSALON) 200 MG capsule, Take 1 capsule (200 mg total) by mouth 3 (three) times daily as needed., Disp: 20 capsule, Rfl: 0   cetirizine (ZYRTEC ALLERGY) 10 MG tablet, Take 1 tablet (10 mg total) by mouth daily., Disp: 30 tablet, Rfl: 0   dicyclomine  (BENTYL) 10 MG capsule, Take 10 mg by mouth 2 (two) times daily., Disp: , Rfl:    fluticasone (FLONASE) 50 MCG/ACT nasal spray, Place 2 sprays into both nostrils in the morning and at bedtime., Disp: 15.8 mL, Rfl: 0   folic acid (FOLVITE) 800 MCG tablet, Take 800 mcg by mouth at bedtime., Disp: , Rfl:    levothyroxine (SYNTHROID, LEVOTHROID) 150 MCG tablet, Take 150 mcg by mouth daily before breakfast., Disp: , Rfl:    lithium carbonate (LITHOBID) 300 MG ER tablet, TAKE 2 TABLET EVERY MORNING AND 2 TABLETS EVERY EVENING FOR A TOTAL OF 4 TABLETS DAILY, Disp: 360 tablet, Rfl: 1   metoprolol succinate (TOPROL-XL) 50 MG 24 hr tablet, Take 50 mg by mouth at bedtime., Disp: , Rfl:    Multiple Vitamin (MULTIVITAMIN WITH MINERALS) TABS tablet, Take 1 tablet by mouth at bedtime., Disp: , Rfl:    omeprazole (PRILOSEC OTC) 20 MG tablet, Take 20 mg by mouth at bedtime. Take one tablet (20 mg) by mouth every other night, Disp: , Rfl:    oxyCODONE (OXY IR/ROXICODONE) 5 MG immediate release tablet, Take 1 tablet (5 mg total) by mouth every 6 (six) hours as needed for severe pain., Disp: 15 tablet, Rfl: 0   predniSONE (DELTASONE) 10 MG tablet, Take 3 tablets (30 mg total) by mouth daily with breakfast. (Patient not taking: Reported on 03/08/2023), Disp: 15 tablet, Rfl: 0 Medication Side Effects: none  Family Medical/ Social History: Changes? No  MENTAL HEALTH EXAM:  There were no vitals taken for this visit.There is no height or weight on file to calculate BMI.  General Appearance: Casual, Neat, and Well Groomed  Eye Contact:  NA  Speech:  Clear and Coherent  Volume:  Normal  Mood:  NA  Affect:  Appropriate  Thought Process:  Coherent  Orientation:  Full (Time, Place, and Person)  Thought Content: Logical   Suicidal Thoughts:  No  Homicidal Thoughts:  No  Memory:  WNL  Judgement:  Good  Insight:  Good  Psychomotor Activity:  Normal  Concentration:  Concentration: Good  Recall:  Good  Fund of  Knowledge: Good  Language: Good  Assets:  Desire for Improvement  ADL's:  Intact  Cognition: WNL  Prognosis:  Good    DIAGNOSES:    ICD-10-CM   1. Hx of bipolar disorder  Z86.59       Receiving Psychotherapy: No    RECOMMENDATIONS:   Greater than 50% of  30  min face to face time with patient was spent on counseling and coordination of care. We continued to discussed her concerns over stopping or weaning off of lithium. Lithium levels are now within therapeutic range at 0.6. Kidney function normal.  Reinforced risk of relapse.  We discussed her goals for care in this office. We agreed to: To continue lithium to 600 mg in the a.m. and 600 mg in the p.m. Pt is f/u with PCP and will have another lab draw with her in June. She wants to follow back up with this office in July. Still trying to make decision about weaning off Lithium.  Follow-up in this office in approximately 12  weeks to reassess Provided emergency contact information Will report worsening symptoms or side effects promptly Patient did not need any refills at this time Reviewed PDMP   Anita Flores, NP

## 2023-06-26 DIAGNOSIS — R197 Diarrhea, unspecified: Secondary | ICD-10-CM | POA: Diagnosis not present

## 2023-06-26 DIAGNOSIS — R1031 Right lower quadrant pain: Secondary | ICD-10-CM | POA: Diagnosis not present

## 2023-06-26 DIAGNOSIS — R112 Nausea with vomiting, unspecified: Secondary | ICD-10-CM | POA: Diagnosis not present

## 2023-09-17 ENCOUNTER — Other Ambulatory Visit: Payer: Self-pay

## 2023-09-17 ENCOUNTER — Emergency Department (HOSPITAL_COMMUNITY)

## 2023-09-17 ENCOUNTER — Emergency Department (HOSPITAL_COMMUNITY)
Admission: EM | Admit: 2023-09-17 | Discharge: 2023-09-17 | Disposition: A | Discharge status: Home / Self Care | Attending: Emergency Medicine | Admitting: Emergency Medicine

## 2023-09-17 DIAGNOSIS — R404 Transient alteration of awareness: Secondary | ICD-10-CM | POA: Diagnosis not present

## 2023-09-17 DIAGNOSIS — I10 Essential (primary) hypertension: Secondary | ICD-10-CM | POA: Insufficient documentation

## 2023-09-17 DIAGNOSIS — Z79899 Other long term (current) drug therapy: Secondary | ICD-10-CM | POA: Diagnosis not present

## 2023-09-17 DIAGNOSIS — R519 Headache, unspecified: Secondary | ICD-10-CM | POA: Diagnosis not present

## 2023-09-17 DIAGNOSIS — J9811 Atelectasis: Secondary | ICD-10-CM | POA: Diagnosis not present

## 2023-09-17 DIAGNOSIS — J90 Pleural effusion, not elsewhere classified: Secondary | ICD-10-CM | POA: Diagnosis not present

## 2023-09-17 DIAGNOSIS — R0600 Dyspnea, unspecified: Secondary | ICD-10-CM | POA: Diagnosis not present

## 2023-09-17 DIAGNOSIS — R0989 Other specified symptoms and signs involving the circulatory and respiratory systems: Secondary | ICD-10-CM | POA: Diagnosis not present

## 2023-09-17 DIAGNOSIS — R000 Tachycardia, unspecified: Secondary | ICD-10-CM | POA: Diagnosis not present

## 2023-09-17 DIAGNOSIS — R0602 Shortness of breath: Secondary | ICD-10-CM | POA: Diagnosis not present

## 2023-09-17 DIAGNOSIS — Z853 Personal history of malignant neoplasm of breast: Secondary | ICD-10-CM | POA: Insufficient documentation

## 2023-09-17 DIAGNOSIS — U071 COVID-19: Secondary | ICD-10-CM | POA: Diagnosis not present

## 2023-09-17 DIAGNOSIS — R531 Weakness: Secondary | ICD-10-CM | POA: Diagnosis not present

## 2023-09-17 DIAGNOSIS — I517 Cardiomegaly: Secondary | ICD-10-CM | POA: Diagnosis not present

## 2023-09-17 DIAGNOSIS — R4789 Other speech disturbances: Secondary | ICD-10-CM | POA: Diagnosis not present

## 2023-09-17 LAB — BLOOD GAS, VENOUS
Acid-base deficit: 7.1 mmol/L — ABNORMAL HIGH (ref 0.0–2.0)
Bicarbonate: 16.8 mmol/L — ABNORMAL LOW (ref 20.0–28.0)
Drawn by: 8706
O2 Saturation: 83.3 %
Patient temperature: 37
pCO2, Ven: 29 mmHg — ABNORMAL LOW (ref 44–60)
pH, Ven: 7.37 (ref 7.25–7.43)
pO2, Ven: 49 mmHg — ABNORMAL HIGH (ref 32–45)

## 2023-09-17 LAB — CBC WITH DIFFERENTIAL/PLATELET
Abs Immature Granulocytes: 0.05 K/uL (ref 0.00–0.07)
Basophils Absolute: 0 K/uL (ref 0.0–0.1)
Basophils Relative: 0 %
Eosinophils Absolute: 0 K/uL (ref 0.0–0.5)
Eosinophils Relative: 1 %
HCT: 43 % (ref 36.0–46.0)
Hemoglobin: 13.8 g/dL (ref 12.0–15.0)
Immature Granulocytes: 1 %
Lymphocytes Relative: 4 %
Lymphs Abs: 0.3 K/uL — ABNORMAL LOW (ref 0.7–4.0)
MCH: 31.3 pg (ref 26.0–34.0)
MCHC: 32.1 g/dL (ref 30.0–36.0)
MCV: 97.5 fL (ref 80.0–100.0)
Monocytes Absolute: 0.5 K/uL (ref 0.1–1.0)
Monocytes Relative: 6 %
Neutro Abs: 7.2 K/uL (ref 1.7–7.7)
Neutrophils Relative %: 88 %
Platelets: 218 K/uL (ref 150–400)
RBC: 4.41 MIL/uL (ref 3.87–5.11)
RDW: 13.9 % (ref 11.5–15.5)
WBC: 8.1 K/uL (ref 4.0–10.5)
nRBC: 0 % (ref 0.0–0.2)

## 2023-09-17 LAB — BASIC METABOLIC PANEL WITH GFR
Anion gap: 13 (ref 5–15)
BUN: 19 mg/dL (ref 6–20)
CO2: 20 mmol/L — ABNORMAL LOW (ref 22–32)
Calcium: 9.7 mg/dL (ref 8.9–10.3)
Chloride: 105 mmol/L (ref 98–111)
Creatinine, Ser: 1.28 mg/dL — ABNORMAL HIGH (ref 0.44–1.00)
GFR, Estimated: 48 mL/min — ABNORMAL LOW (ref >60)
Glucose, Bld: 109 mg/dL — ABNORMAL HIGH (ref 70–99)
Potassium: 3.9 mmol/L (ref 3.5–5.1)
Sodium: 138 mmol/L (ref 135–145)

## 2023-09-17 LAB — HEPATIC FUNCTION PANEL
ALT: 25 U/L (ref 0–44)
AST: 42 U/L — ABNORMAL HIGH (ref 15–41)
Albumin: 3.5 g/dL (ref 3.5–5.0)
Alkaline Phosphatase: 81 U/L (ref 38–126)
Bilirubin, Direct: 0.2 mg/dL (ref 0.0–0.2)
Indirect Bilirubin: 0.5 mg/dL (ref 0.3–0.9)
Total Bilirubin: 0.7 mg/dL (ref 0.0–1.2)
Total Protein: 7.2 g/dL (ref 6.5–8.1)

## 2023-09-17 LAB — RESP PANEL BY RT-PCR (RSV, FLU A&B, COVID)  RVPGX2
Influenza A by PCR: NEGATIVE
Influenza B by PCR: NEGATIVE
Resp Syncytial Virus by PCR: NEGATIVE
SARS Coronavirus 2 by RT PCR: POSITIVE — AB

## 2023-09-17 LAB — AMMONIA: Ammonia: 24 umol/L (ref 9–35)

## 2023-09-17 LAB — TROPONIN I (HIGH SENSITIVITY)
Troponin I (High Sensitivity): 6 ng/L (ref <18)
Troponin I (High Sensitivity): 6 ng/L (ref <18)

## 2023-09-17 LAB — LITHIUM LEVEL: Lithium Lvl: 0.74 mmol/L (ref 0.60–1.20)

## 2023-09-17 LAB — ACETAMINOPHEN LEVEL: Acetaminophen (Tylenol), Serum: <10 ug/mL — ABNORMAL LOW (ref 10–30)

## 2023-09-17 LAB — TSH: TSH: 0.665 u[IU]/mL (ref 0.350–4.500)

## 2023-09-17 LAB — BRAIN NATRIURETIC PEPTIDE: B Natriuretic Peptide: 35.8 pg/mL (ref 0.0–100.0)

## 2023-09-17 LAB — MAGNESIUM: Magnesium: 1.9 mg/dL (ref 1.7–2.4)

## 2023-09-17 LAB — SALICYLATE LEVEL: Salicylate Lvl: <7 mg/dL — ABNORMAL LOW (ref 7.0–30.0)

## 2023-09-17 MED ORDER — LACTATED RINGERS IV BOLUS
1000.0000 mL | Freq: Once | INTRAVENOUS | Status: AC
  Administered 2023-09-17: 1000 mL via INTRAVENOUS

## 2023-09-17 MED ORDER — IOHEXOL 350 MG/ML SOLN
75.0000 mL | Freq: Once | INTRAVENOUS | Status: AC | PRN
  Administered 2023-09-17: 75 mL via INTRAVENOUS

## 2023-09-17 MED ORDER — METOCLOPRAMIDE HCL 5 MG/ML IJ SOLN
10.0000 mg | Freq: Once | INTRAMUSCULAR | Status: AC
  Administered 2023-09-17: 10 mg via INTRAVENOUS
  Filled 2023-09-17: qty 2

## 2023-09-17 MED ORDER — ACETAMINOPHEN 500 MG PO TABS
1000.0000 mg | ORAL_TABLET | Freq: Once | ORAL | Status: AC
  Administered 2023-09-17: 1000 mg via ORAL
  Filled 2023-09-17: qty 2

## 2023-09-17 MED ORDER — KETOROLAC TROMETHAMINE 15 MG/ML IJ SOLN
15.0000 mg | Freq: Once | INTRAMUSCULAR | Status: AC
  Administered 2023-09-17: 15 mg via INTRAVENOUS
  Filled 2023-09-17: qty 1

## 2023-09-17 MED ORDER — ONDANSETRON 4 MG PO TBDP: 4.0000 mg | ORAL_TABLET | Freq: Three times a day (TID) | ORAL | 0 refills | Status: AC | PRN

## 2023-09-17 NOTE — ED Triage Notes (Signed)
 Pt bib EMS for shortness of breath. Per EMS pt showed up at PCP without appt with shortness of breath and brain fog. Pt A+Ox4. GCS 15. Pt complains of fatigue. Recent travel to [place]. Complains of shortness of breath for a couple of months that got worse today. 90% RA for EMS. Satting 96% on 2L [place].

## 2023-09-17 NOTE — ED Notes (Signed)
 Pt ambulated to bedside commode. Overall did well, complained of some dizziness.

## 2023-09-17 NOTE — ED Notes (Addendum)
 Pt A+Ox4, having mild aphasia. Difficulty getting words out/lots of ums while speaking. GCS 15. MD at bedside. NIH 1 for aphasia.

## 2023-09-17 NOTE — ED Notes (Signed)
 Unable to get IV for CT x3 attempts between this RN and secondary RN. IV team consult placed.

## 2023-09-17 NOTE — ED Notes (Addendum)
 SABRA

## 2023-09-17 NOTE — ED Provider Notes (Signed)
 [place] EMERGENCY DEPARTMENT AT [facility] Provider Note   CSN: [id number] Arrival date & time: 09/17/23  1555     Patient presents with: Shortness of Breath   Anita Joyce is a 58 y.o. female.    Shortness of Breath Associated symptoms: headaches   Patient presents for shortness of breath.  Medical history includes HTN, IBS, GERD, depression, bipolar disorder, breast cancer.  She describes shortness of breath for the past several months that got worse today.  She did have some recent travel.  Shortness of breath worsened today.  She went to her primary care doctor's office.  Primary care doctor was concerned about confusion.  She was noted to be 90% on room air with EMS.  She was placed on supplemental oxygen.  [name] is headache but denies any other areas of discomfort.     Prior to Admission medications   Medication Sig Start Date End Date Taking? Authorizing Provider  ondansetron  (ZOFRAN -ODT) 4 MG disintegrating tablet Take 1 tablet (4 mg total) by mouth every 8 (eight) hours as needed. 09/17/23  Yes Melvenia Motto, MD  amLODipine (NORVASC) 10 MG tablet Take 10 mg by mouth at bedtime. 12/11/17   [provider]  amoxicillin -clavulanate (AUGMENTIN ) 875-125 MG tablet Take 1 tablet by mouth every 12 (twelve) hours. Patient not taking: Reported on 03/08/2023 01/11/23   Mayer, Jodi R, NP  azelastine  (ASTELIN ) 0.1 % nasal spray Place 2 sprays into both nostrils 2 (two) times daily. Use in each nostril as directed 03/08/23   Tobie Comp B, MD  b complex vitamins tablet Take 1 tablet by mouth at bedtime.    [provider]  benzonatate  (TESSALON ) 200 MG capsule Take 1 capsule (200 mg total) by mouth 3 (three) times daily as needed. 01/08/23   Loreda Myla SAUNDERS, NP  cetirizine  (ZYRTEC  ALLERGY) 10 MG tablet Take 1 tablet (10 mg total) by mouth daily. 01/24/23   Christopher Savannah, PA-C  dicyclomine (BENTYL) 10 MG capsule Take 10 mg by mouth 2 (two) times daily.  01/03/18   [provider]  fluticasone  (FLONASE ) 50 MCG/ACT nasal spray Place 2 sprays into both nostrils in the morning and at bedtime. 03/08/23   Tobie Comp NOVAK, MD  folic acid  (FOLVITE ) 800 MCG tablet Take 800 mcg by mouth at bedtime.    [provider]  levothyroxine (SYNTHROID, LEVOTHROID) 150 MCG tablet Take 150 mcg by mouth daily before breakfast.    [provider]  lithium  carbonate (LITHOBID ) 300 MG ER tablet TAKE 2 TABLET EVERY MORNING AND 2 TABLETS EVERY EVENING FOR A TOTAL OF 4 TABLETS DAILY 05/23/23   Teresa Rogue A, NP  metoprolol succinate (TOPROL-XL) 50 MG 24 hr tablet Take 50 mg by mouth at bedtime. 02/18/20   [provider]  Multiple Vitamin (MULTIVITAMIN WITH MINERALS) TABS tablet Take 1 tablet by mouth at bedtime.    [provider]  omeprazole (PRILOSEC OTC) 20 MG tablet Take 20 mg by mouth at bedtime. Take one tablet (20 mg) by mouth every other night    [provider]  oxyCODONE  (OXY IR/ROXICODONE ) 5 MG immediate release tablet Take 1 tablet (5 mg total) by mouth every 6 (six) hours as needed for severe pain. 04/14/20   Aron Shoulders, MD  predniSONE  (DELTASONE ) 10 MG tablet Take 3 tablets (30 mg total) by mouth daily with breakfast. Patient not taking: Reported on 03/08/2023 01/24/23   Christopher Savannah, PA-C    Allergies: Patient has no known allergies.  Review of Systems  Respiratory:  Positive for shortness of breath.   Neurological:  Positive for headaches.  Psychiatric/Behavioral:  Positive for confusion.   All other systems reviewed and are negative.   Updated Vital Signs BP (!) 121/105   Pulse (!) 105   Temp 99.8 F (37.7 C) (Oral)   Resp (!) 24   LMP  (LMP Unknown)   SpO2 96%   Physical Exam Vitals and nursing note reviewed.  Constitutional:      General: She is not in acute distress.    Appearance: She is well-developed. She is not ill-appearing, toxic-appearing or diaphoretic.  HENT:     Head:  Normocephalic and atraumatic.     Mouth/Throat:     Mouth: Mucous membranes are moist.  Eyes:     Conjunctiva/sclera: Conjunctivae normal.  Cardiovascular:     Rate and Rhythm: Normal rate and regular rhythm.     Heart sounds: No murmur heard. Pulmonary:     Effort: Pulmonary effort is normal. No respiratory distress.     Breath sounds: Normal breath sounds. No decreased breath sounds, wheezing, rhonchi or rales.  Chest:     Chest wall: No tenderness.  Abdominal:     Palpations: Abdomen is soft.     Tenderness: There is no abdominal tenderness.  Musculoskeletal:        General: No swelling. Normal range of motion.     Cervical back: Normal range of motion and neck supple.     Right lower leg: No edema.     Left lower leg: No edema.  Skin:    General: Skin is warm and dry.  Neurological:     General: No focal deficit present.     Mental Status: She is alert and oriented to person, place, and time.  Psychiatric:        Mood and Affect: Mood normal.        Behavior: Behavior normal.     (all labs ordered are listed, but only abnormal results are displayed) Labs Reviewed  RESP PANEL BY RT-PCR (RSV, FLU A&B, COVID)  RVPGX2 - Abnormal; Notable for the following components:      Result Value   SARS Coronavirus 2 by RT PCR POSITIVE (*)    All other components within normal limits  BASIC METABOLIC PANEL WITH GFR - Abnormal; Notable for the following components:   CO2 20 (*)    Glucose, Bld 109 (*)    Creatinine, Ser 1.28 (*)    GFR, Estimated 48 (*)    All other components within normal limits  CBC WITH DIFFERENTIAL/PLATELET - Abnormal; Notable for the following components:   Lymphs Abs 0.3 (*)    All other components within normal limits  BLOOD GAS, VENOUS - Abnormal; Notable for the following components:   pCO2, Ven 29 (*)    pO2, Ven 49 (*)    Bicarbonate 16.8 (*)    Acid-base deficit 7.1 (*)    All other components within normal limits  HEPATIC FUNCTION PANEL -  Abnormal; Notable for the following components:   AST 42 (*)    All other components within normal limits  ACETAMINOPHEN  LEVEL - Abnormal; Notable for the following components:   Acetaminophen  (Tylenol ), Serum <10 (*)    All other components within normal limits  SALICYLATE LEVEL - Abnormal; Notable for the following components:   Salicylate Lvl <7.0 (*)    All other components within normal limits  BRAIN NATRIURETIC PEPTIDE  MAGNESIUM  AMMONIA  TSH  LITHIUM  LEVEL  TROPONIN I (HIGH SENSITIVITY)  TROPONIN I (HIGH SENSITIVITY)    EKG: EKG Interpretation Date/Time:  Tuesday September 17 2023 15:57:19 EDT Ventricular Rate:  116 PR Interval:  175 QRS Duration:  83 QT Interval:  359 QTC Calculation: 499 R Axis:   5  Text Interpretation: Sinus tachycardia Probable left atrial enlargement Borderline T abnormalities, diffuse leads Borderline prolonged QT interval Confirmed by Melvenia Motto ([id number]) on 09/17/2023 5:11:05 PM  Radiology: MR BRAIN WO CONTRAST Result Date: 09/17/2023 CLINICAL DATA:  Initial evaluation for acute mental status change, unknown cause. EXAM: MRI HEAD WITHOUT CONTRAST TECHNIQUE: Multiplanar, multiecho pulse sequences of the brain and surrounding structures were obtained without intravenous contrast. COMPARISON:  CT from earlier the same day. FINDINGS: Brain: Cerebral volume within normal limits. No focal parenchymal signal abnormality. No evidence for acute or subacute infarct. No areas of chronic cortical infarction. No acute or chronic intracranial blood products. No mass lesion, midline shift or mass effect. No hydrocephalus or extra-axial fluid collection. Partially empty sella noted. Vascular: Major intracranial vascular flow voids are maintained. Skull and upper cervical spine: Craniocervical junction within normal limits. Diffuse loss of normal bone marrow signal, nonspecific but can be seen with anemia, smoking, obesity, and infiltrative/myelofibrotic marrow processes. No  scalp soft tissue abnormality. Sinuses/Orbits: Globes orbital soft tissues within normal limits. Mild mucosal thickening present about the ethmoidal air cells and maxillary sinuses. Small right mastoid effusion noted, bowel significance. Other: None. IMPRESSION: Normal brain MRI.  No acute intracranial abnormality. Electronically Signed   By: Morene Hoard M.D.   On: 09/17/2023 22:58   CT Head Wo Contrast Result Date: 09/17/2023 CLINICAL DATA:  Headache EXAM: CT HEAD WITHOUT CONTRAST TECHNIQUE: Contiguous axial images were obtained from the base of the skull through the vertex without intravenous contrast. RADIATION DOSE REDUCTION: This exam was performed according to the departmental dose-optimization program which includes automated exposure control, adjustment of the mA and/or kV according to patient size and/or use of iterative reconstruction technique. COMPARISON:  MRI 02/08/2018 FINDINGS: Brain: No evidence of acute infarction, hemorrhage, hydrocephalus, extra-axial collection or mass lesion/mass effect. Vascular: No hyperdense vessel or unexpected calcification. Skull: Normal. Negative for fracture or focal lesion. Sinuses/Orbits: Minimal mucosal thickening in the left maxillary sinus Other: None IMPRESSION: Negative non contrasted CT appearance of the brain. Electronically Signed   By: Luke Bun M.D.   On: 09/17/2023 21:05   CT Angio Chest PE W and/or Wo Contrast Result Date: 09/17/2023 CLINICAL DATA:  Brain fog headache dyspnea EXAM: CT ANGIOGRAPHY CHEST WITH CONTRAST TECHNIQUE: Multidetector CT imaging of the chest was performed using the standard protocol during bolus administration of intravenous contrast. Multiplanar CT image reconstructions and MIPs were obtained to evaluate the vascular anatomy. RADIATION DOSE REDUCTION: This exam was performed according to the departmental dose-optimization program which includes automated exposure control, adjustment of the mA and/or kV according to  patient size and/or use of iterative reconstruction technique. CONTRAST:  75mL OMNIPAQUE  IOHEXOL  350 MG/ML SOLN COMPARISON:  Chest x-ray 09/17/2023 FINDINGS: Cardiovascular: Satisfactory opacification of the pulmonary arteries to the segmental level. No evidence of pulmonary embolism. Nonaneurysmal aorta. Mild cardiomegaly. No pericardial effusion Mediastinum/Nodes: Patent trachea. No thyroid  mass. No suspicious lymph nodes. Esophagus within normal limits Lungs/Pleura: Trace pleural effusions.  Mild dependent atelectasis. Upper Abdomen: No acute finding Musculoskeletal: Breast prostheses.  No acute osseous abnormality Review of the MIP images confirms the above findings. IMPRESSION: 1. Negative for acute pulmonary embolus. 2. Trace pleural effusions with mild dependent atelectasis.  Mild cardiomegaly. Electronically Signed   By: Luke Bun M.D.   On: 09/17/2023 21:03   DG Chest Port 1 View Result Date: 09/17/2023 CLINICAL DATA:  dyspnea EXAM: PORTABLE CHEST - 1 VIEW COMPARISON:  Jul 22, 2006 FINDINGS: Low lung volumes with bronchovascular crowding and streaky bibasilar atelectasis. No focal airspace consolidation, pleural effusion, or pneumothorax. Mild cardiomegaly. No acute fracture or destructive lesions. Multilevel thoracic osteophytosis. IMPRESSION: Low lung volumes with streaky bibasilar atelectasis. Electronically Signed   By: Rogelia Myers M.D.   On: 09/17/2023 16:42     Procedures   Medications Ordered in the ED  acetaminophen  (TYLENOL ) tablet 1,000 mg (has no administration in time range)  lactated ringers  bolus 1,000 mL (0 mLs Intravenous Stopped 09/17/23 2026)  metoCLOPramide  (REGLAN ) injection 10 mg (10 mg Intravenous Given 09/17/23 1813)  ketorolac  (TORADOL ) 15 MG/ML injection 15 mg (15 mg Intravenous Given 09/17/23 2026)  iohexol  (OMNIPAQUE ) 350 MG/ML injection 75 mL (75 mLs Intravenous Contrast Given 09/17/23 2053)                                    Medical Decision Making Amount  and/or Complexity of Data Reviewed Labs: ordered. Radiology: ordered.  Risk Prescription drug management.   This patient presents to the ED for concern of shortness of breath and confusion, this involves an extensive number of treatment options, and is a complaint that carries with it a high risk of complications and morbidity.  The differential diagnosis includes pneumonia, PE, URI, polypharmacy, with toxicity, CVA, TIA, seizure   Co morbidities / Chronic conditions that complicate the patient evaluation  HTN, IBS, GERD, depression, bipolar disorder, breast cancer   Additional history obtained:  Additional history obtained from EMR External records from outside source obtained and reviewed including N/A   Lab Tests:  I Ordered, and personally interpreted labs.  The pertinent results include: Normal hemoglobin, no leukocytosis, mild increase in creatinine when compared to lab work from 3 years ago, normal electrolytes, normal lithium  level.  Patient did test positive for COVID-19   Imaging Studies ordered:  I ordered imaging studies including chest x-ray, CTA chest, CT head, MRI brain I independently visualized and interpreted imaging which showed no acute findings I agree with the radiologist interpretation   Cardiac Monitoring: / EKG:  The patient was maintained on a cardiac monitor.  I personally viewed and interpreted the cardiac monitored which showed an underlying rhythm of: Sinus rhythm   Problem List / ED Course / Critical interventions / Medication management  Patient presents for onset of shortness of breath and confusion today.  Although she does state that she has had some mild shortness of breath over the past several months, the seem to worsen today.  She went to her PCPs office and PCP was concerned about patient seeming confused.  EMS was called.  EMS noted SpO2 of 90% on room air and she was placed on supplemental oxygen.  On arrival in the ED, patient is  alert and oriented.  She has no focal neurologic deficits.  She does at times pause to think of her words.  Despite this, she is able to have a normal conversation.  She was placed on cardiac monitor.  Workup was initiated.  Serum lab work was unremarkable.  Her creatinine is increased when compared to lab work from 3 years ago.  IV fluids were ordered for hydration.  She did test positive  for COVID-19.  Patient underwent multiple imaging studies.  No acute findings were identified on CT or MRI imaging.  On reassessment, patient has SpO2 of 100% on room air.  Her breathing remains unlabored.  She is much more alert and attentive.  She does endorse an ongoing headache and feels that she may be developing a fever.  Tylenol  was ordered.  Patient was advised to continue supportive care at home.  She was discharged in stable condition. I ordered medication including IV fluids for hydration, Toradol  for headache, Reglan  for nausea, Tylenol  for subjective fever Reevaluation of the patient after these medicines showed that the patient improved I have reviewed the patients home medicines and have made adjustments as needed  Social Determinants of Health:  Lives independently     Final diagnoses:  COVID-19    ED Discharge Orders          Ordered    ondansetron  (ZOFRAN -ODT) 4 MG disintegrating tablet  Every 8 hours PRN        09/17/23 2323               Melvenia Motto, MD 09/17/23 2323

## 2023-09-17 NOTE — Discharge Instructions (Addendum)
 You have COVID.  Treatment for this is supportive care.  This includes Tylenol  and ibuprofen for headaches, body aches, fevers.  You should drink plenty fluids to stay hydrated.  A prescription for a medication to treat nausea was sent to your pharmacy.  Take this as needed.  Symptoms should improve with time.  Return to the emergency department for any new or worsening symptoms of concern.

## 2023-09-17 NOTE — ED Notes (Signed)
 CCMD called, pt on monitor

## 2023-09-23 DIAGNOSIS — E039 Hypothyroidism, unspecified: Secondary | ICD-10-CM | POA: Diagnosis not present

## 2023-09-23 DIAGNOSIS — E669 Obesity, unspecified: Secondary | ICD-10-CM | POA: Diagnosis not present

## 2023-09-23 DIAGNOSIS — F3174 Bipolar disorder, in full remission, most recent episode manic: Secondary | ICD-10-CM | POA: Diagnosis not present

## 2023-09-23 DIAGNOSIS — Z0000 Encounter for general adult medical examination without abnormal findings: Secondary | ICD-10-CM | POA: Diagnosis not present

## 2023-09-23 DIAGNOSIS — Z79899 Other long term (current) drug therapy: Secondary | ICD-10-CM | POA: Diagnosis not present

## 2023-11-05 DIAGNOSIS — H43393 Other vitreous opacities, bilateral: Secondary | ICD-10-CM | POA: Diagnosis not present

## 2023-11-05 DIAGNOSIS — H52223 Regular astigmatism, bilateral: Secondary | ICD-10-CM | POA: Diagnosis not present

## 2023-11-18 ENCOUNTER — Other Ambulatory Visit: Payer: Self-pay | Admitting: Behavioral Health

## 2023-11-18 DIAGNOSIS — Z8659 Personal history of other mental and behavioral disorders: Secondary | ICD-10-CM

## 2023-12-05 DIAGNOSIS — R200 Anesthesia of skin: Secondary | ICD-10-CM | POA: Diagnosis not present

## 2023-12-05 DIAGNOSIS — M79674 Pain in right toe(s): Secondary | ICD-10-CM | POA: Diagnosis not present

## 2023-12-05 DIAGNOSIS — L299 Pruritus, unspecified: Secondary | ICD-10-CM | POA: Diagnosis not present

## 2023-12-05 DIAGNOSIS — R0789 Other chest pain: Secondary | ICD-10-CM | POA: Diagnosis not present

## 2023-12-12 ENCOUNTER — Ambulatory Visit: Admitting: Behavioral Health

## 2023-12-16 DIAGNOSIS — M79674 Pain in right toe(s): Secondary | ICD-10-CM | POA: Diagnosis not present

## 2023-12-16 DIAGNOSIS — Z9109 Other allergy status, other than to drugs and biological substances: Secondary | ICD-10-CM | POA: Diagnosis not present

## 2023-12-16 DIAGNOSIS — E039 Hypothyroidism, unspecified: Secondary | ICD-10-CM | POA: Diagnosis not present

## 2023-12-27 ENCOUNTER — Ambulatory Visit: Admitting: Behavioral Health

## 2023-12-27 ENCOUNTER — Encounter: Payer: Self-pay | Admitting: Behavioral Health

## 2023-12-27 DIAGNOSIS — Z8659 Personal history of other mental and behavioral disorders: Secondary | ICD-10-CM

## 2023-12-27 NOTE — Progress Notes (Signed)
 [facility]  Patient ID: Anita Joyce,  MRN: [id number]  PCP: Claudene Pellet, MD  Date of Evaluation: 12/27/2023 Time spent:30 minutes  Chief Complaint:  Chief Complaint   Anxiety; Depression; Follow-up; Patient Education; Medication Refill     HISTORY/CURRENT STATUS: HPI Anita Joyce, 58 year old female present to this office for follow up and medication management. No change this visit. Still concerned about what to do with her lithium . She does not want to make any medication adjustment today. Says her lithium  levels are now within therapeutic range.  She said that she got confused last visit and did not reduce her lithium  as instructed.  Will start tomorrow reducing to 900 mg/day from 1200 mg. She denies any depression currently and says anxiety is 2/10. She denies any mania, no psychosis, no auditory or visual hallucinations. Denies SI or Hi.   She does not remember any other psychiatric  medications trials from the past at this time. Will obtain list.       Individual Medical History/ Review of Systems: Changes? :No   Allergies: Patient has no known allergies.  Current Medications:  Current Outpatient Medications:    amLODipine (NORVASC) 10 MG tablet, Take 10 mg by mouth at bedtime., Disp: , Rfl:    amoxicillin -clavulanate (AUGMENTIN ) 875-125 MG tablet, Take 1 tablet by mouth every 12 (twelve) hours. (Patient not taking: Reported on 03/08/2023), Disp: 14 tablet, Rfl: 0   azelastine  (ASTELIN ) 0.1 % nasal spray, Place 2 sprays into both nostrils 2 (two) times daily. Use in each nostril as directed, Disp: 30 mL, Rfl: 12   b complex vitamins tablet, Take 1 tablet by mouth at bedtime., Disp: , Rfl:    benzonatate  (TESSALON ) 200 MG capsule, Take 1 capsule (200 mg total) by mouth 3 (three) times daily as needed., Disp: 20 capsule, Rfl: 0   cetirizine  (ZYRTEC  ALLERGY) 10 MG tablet, Take 1 tablet (10 mg total) by mouth daily., Disp: 30 tablet, Rfl: 0   dicyclomine  (BENTYL) 10 MG capsule, Take 10 mg by mouth 2 (two) times daily., Disp: , Rfl:    fluticasone  (FLONASE ) 50 MCG/ACT nasal spray, Place 2 sprays into both nostrils in the morning and at bedtime., Disp: 15.8 mL, Rfl: 0   folic acid  (FOLVITE ) 800 MCG tablet, Take 800 mcg by mouth at bedtime., Disp: , Rfl:    levothyroxine (SYNTHROID, LEVOTHROID) 150 MCG tablet, Take 150 mcg by mouth daily before breakfast., Disp: , Rfl:    lithium  carbonate (LITHOBID ) 300 MG ER tablet, TAKE 2 TABLETS EVERY MORNING AND 2 TABLETS EVERY EVENING FOR A TOTAL OF 4 TABLETS DAILY, Disp: 360 tablet, Rfl: 1   metoprolol succinate (TOPROL-XL) 50 MG 24 hr tablet, Take 50 mg by mouth at bedtime., Disp: , Rfl:    Multiple Vitamin (MULTIVITAMIN WITH MINERALS) TABS tablet, Take 1 tablet by mouth at bedtime., Disp: , Rfl:    omeprazole (PRILOSEC OTC) 20 MG tablet, Take 20 mg by mouth at bedtime. Take one tablet (20 mg) by mouth every other night, Disp: , Rfl:    ondansetron  (ZOFRAN -ODT) 4 MG disintegrating tablet, Take 1 tablet (4 mg total) by mouth every 8 (eight) hours as needed., Disp: 20 tablet, Rfl: 0   oxyCODONE  (OXY IR/ROXICODONE ) 5 MG immediate release tablet, Take 1 tablet (5 mg total) by mouth every 6 (six) hours as needed for severe pain., Disp: 15 tablet, Rfl: 0   predniSONE  (DELTASONE ) 10 MG tablet, Take 3 tablets (30 mg total) by mouth daily with breakfast. (  Patient not taking: Reported on 03/08/2023), Disp: 15 tablet, Rfl: 0 Medication Side Effects: none  Family Medical/ Social History: Changes? No  MENTAL HEALTH EXAM:  There were no vitals taken for this visit.There is no height or weight on file to calculate BMI.  General Appearance: Casual, Neat, and Well Groomed  Eye Contact:  Good  Speech:  Clear and Coherent  Volume:  Normal  Mood:  NA  Affect:  Appropriate  Thought Process:  Coherent  Orientation:  Full (Time, Place, and Person)  Thought Content: Logical   Suicidal Thoughts:  No  Homicidal Thoughts:  No   Memory:  WNL  Judgement:  Good  Insight:  Good  Psychomotor Activity:  Normal  Concentration:  Concentration: Good  Recall:  Good  Fund of Knowledge: Good  Language: Good  Assets:  Desire for Improvement  ADL's:  Intact  Cognition: WNL  Prognosis:  Good    DIAGNOSES:    ICD-10-CM   1. Hx of bipolar disorder  Z86.59       Receiving Psychotherapy: No    RECOMMENDATIONS:   Greater than 50% of  30  min face to face time with patient was spent on counseling and coordination of care. We continued to discussed her concerns over stopping or weaning off of lithium . Lithium  levels are now at 0.74 from labs in July.  Kidney function normal. On Lithium  for 35 years. Discussed her continued problems with excessive thirst.  We discussed her goals for care in this office. We agreed to: To continue lithium  to 600 mg in the a.m. and 300 mg in the p.m.  She wants to follow back up with this office in July. Still trying to make decision about weaning off Lithium .  Follow-up in this office in approximately 12  weeks to reassess Provided emergency contact information Will report worsening symptoms or side effects promptly Patient did not need any refills at this time Reviewed PDMP      Redell DELENA Pizza, NP

## 2023-12-31 ENCOUNTER — Emergency Department (HOSPITAL_BASED_OUTPATIENT_CLINIC_OR_DEPARTMENT_OTHER)

## 2023-12-31 ENCOUNTER — Encounter (HOSPITAL_BASED_OUTPATIENT_CLINIC_OR_DEPARTMENT_OTHER): Payer: Self-pay | Admitting: Emergency Medicine

## 2023-12-31 ENCOUNTER — Emergency Department (HOSPITAL_BASED_OUTPATIENT_CLINIC_OR_DEPARTMENT_OTHER)
Admission: EM | Admit: 2023-12-31 | Discharge: 2023-12-31 | Disposition: A | Discharge status: Home / Self Care | Attending: Emergency Medicine | Admitting: Emergency Medicine

## 2023-12-31 ENCOUNTER — Other Ambulatory Visit: Payer: Self-pay

## 2023-12-31 DIAGNOSIS — Z853 Personal history of malignant neoplasm of breast: Secondary | ICD-10-CM | POA: Insufficient documentation

## 2023-12-31 DIAGNOSIS — R079 Chest pain, unspecified: Secondary | ICD-10-CM | POA: Diagnosis not present

## 2023-12-31 DIAGNOSIS — R0602 Shortness of breath: Secondary | ICD-10-CM | POA: Diagnosis not present

## 2023-12-31 DIAGNOSIS — M25511 Pain in right shoulder: Secondary | ICD-10-CM | POA: Diagnosis not present

## 2023-12-31 DIAGNOSIS — Z79899 Other long term (current) drug therapy: Secondary | ICD-10-CM | POA: Insufficient documentation

## 2023-12-31 DIAGNOSIS — I10 Essential (primary) hypertension: Secondary | ICD-10-CM | POA: Diagnosis not present

## 2023-12-31 DIAGNOSIS — R0789 Other chest pain: Secondary | ICD-10-CM | POA: Insufficient documentation

## 2023-12-31 LAB — COMPREHENSIVE METABOLIC PANEL WITH GFR
ALT: 18 U/L (ref 0–44)
AST: 25 U/L (ref 15–41)
Albumin: 4.2 g/dL (ref 3.5–5.0)
Alkaline Phosphatase: 111 U/L (ref 38–126)
Anion gap: 12 (ref 5–15)
BUN: 17 mg/dL (ref 6–20)
CO2: 22 mmol/L (ref 22–32)
Calcium: 10 mg/dL (ref 8.9–10.3)
Chloride: 109 mmol/L (ref 98–111)
Creatinine, Ser: 1.01 mg/dL — ABNORMAL HIGH (ref 0.44–1.00)
GFR, Estimated: >60 mL/min (ref >60)
Glucose, Bld: 107 mg/dL — ABNORMAL HIGH (ref 70–99)
Potassium: 4.5 mmol/L (ref 3.5–5.1)
Sodium: 143 mmol/L (ref 135–145)
Total Bilirubin: 0.3 mg/dL (ref 0.0–1.2)
Total Protein: 7.6 g/dL (ref 6.5–8.1)

## 2023-12-31 LAB — CBC WITH DIFFERENTIAL/PLATELET
Abs Immature Granulocytes: 0.02 K/uL (ref 0.00–0.07)
Basophils Absolute: 0 K/uL (ref 0.0–0.1)
Basophils Relative: 0 %
Eosinophils Absolute: 0.3 K/uL (ref 0.0–0.5)
Eosinophils Relative: 4 %
HCT: 41.4 % (ref 36.0–46.0)
Hemoglobin: 13.6 g/dL (ref 12.0–15.0)
Immature Granulocytes: 0 %
Lymphocytes Relative: 25 %
Lymphs Abs: 1.6 K/uL (ref 0.7–4.0)
MCH: 31.1 pg (ref 26.0–34.0)
MCHC: 32.9 g/dL (ref 30.0–36.0)
MCV: 94.5 fL (ref 80.0–100.0)
Monocytes Absolute: 0.4 K/uL (ref 0.1–1.0)
Monocytes Relative: 6 %
Neutro Abs: 4.2 K/uL (ref 1.7–7.7)
Neutrophils Relative %: 65 %
Platelets: 222 K/uL (ref 150–400)
RBC: 4.38 MIL/uL (ref 3.87–5.11)
RDW: 13.2 % (ref 11.5–15.5)
WBC: 6.5 K/uL (ref 4.0–10.5)
nRBC: 0 % (ref 0.0–0.2)

## 2023-12-31 LAB — TROPONIN T, HIGH SENSITIVITY: Troponin T High Sensitivity: <15 ng/L (ref 0–19)

## 2023-12-31 MED ORDER — ASPIRIN 81 MG PO CHEW
324.0000 mg | CHEWABLE_TABLET | Freq: Once | ORAL | Status: AC
  Administered 2023-12-31: 324 mg via ORAL
  Filled 2023-12-31: qty 4

## 2023-12-31 NOTE — ED Notes (Signed)
 Pt states she needs to cancel something for work pt is on cell phone, states I need to use both hands to use phone. Unable to attempt IV access at this time

## 2023-12-31 NOTE — ED Provider Notes (Signed)
 [facility] Provider Note   CSN: [id number] Arrival date & time: 12/31/23  9361     Patient presents with: Chest Pain   Anita Joyce is a 58 y.o. female.  {Add pertinent medical, surgical, social history, OB history to HPI:4251} 58 year old female with a history of hypertension who presents emergency department chest pain.  Patient reports that she was using a glider exercise machine last night and thought that she strained a muscle in the right side of her chest. Started having pain at 9pm.  Worsens when she moves her arm.  Says that she is not sure if it is exertional.  4/10 when she moves her arm.  Had some nausea with it and says that this morning started having some shortness of breath so decided come in.  No cough or pleuritic pain.  No lower extremity swelling.  History of breast cancer in remission for several years.  No history of DVT or PE.  No OCP use.  No hemoptysis.       Prior to Admission medications   Medication Sig Start Date End Date Taking? Authorizing Provider  amLODipine (NORVASC) 10 MG tablet Take 10 mg by mouth at bedtime. 12/11/17   [provider]  amoxicillin -clavulanate (AUGMENTIN ) 875-125 MG tablet Take 1 tablet by mouth every 12 (twelve) hours. Patient not taking: Reported on 03/08/2023 01/11/23   Mayer, Jodi R, NP  azelastine  (ASTELIN ) 0.1 % nasal spray Place 2 sprays into both nostrils 2 (two) times daily. Use in each nostril as directed 03/08/23   Tobie Comp B, MD  b complex vitamins tablet Take 1 tablet by mouth at bedtime.    [provider]  benzonatate  (TESSALON ) 200 MG capsule Take 1 capsule (200 mg total) by mouth 3 (three) times daily as needed. 01/08/23   Mayer, Jodi R, NP  cetirizine  (ZYRTEC  ALLERGY) 10 MG tablet Take 1 tablet (10 mg total) by mouth daily. 01/24/23   Christopher Savannah, PA-C  dicyclomine (BENTYL) 10 MG capsule Take 10 mg by mouth 2 (two) times daily. 01/03/18    [provider]  fluticasone  (FLONASE ) 50 MCG/ACT nasal spray Place 2 sprays into both nostrils in the morning and at bedtime. 03/08/23   Tobie Comp NOVAK, MD  folic acid  (FOLVITE ) 800 MCG tablet Take 800 mcg by mouth at bedtime.    [provider]  levothyroxine (SYNTHROID, LEVOTHROID) 150 MCG tablet Take 150 mcg by mouth daily before breakfast.    [provider]  lithium  carbonate (LITHOBID ) 300 MG ER tablet TAKE 2 TABLETS EVERY MORNING AND 2 TABLETS EVERY EVENING FOR A TOTAL OF 4 TABLETS DAILY 11/19/23   Teresa Rogue A, NP  metoprolol succinate (TOPROL-XL) 50 MG 24 hr tablet Take 50 mg by mouth at bedtime. 02/18/20   [provider]  Multiple Vitamin (MULTIVITAMIN WITH MINERALS) TABS tablet Take 1 tablet by mouth at bedtime.    [provider]  omeprazole (PRILOSEC OTC) 20 MG tablet Take 20 mg by mouth at bedtime. Take one tablet (20 mg) by mouth every other night    [provider]  ondansetron  (ZOFRAN -ODT) 4 MG disintegrating tablet Take 1 tablet (4 mg total) by mouth every 8 (eight) hours as needed. 09/17/23   Melvenia Motto, MD  oxyCODONE  (OXY IR/ROXICODONE ) 5 MG immediate release tablet Take 1 tablet (5 mg total) by mouth every 6 (six) hours as needed for severe pain. 04/14/20   Aron Shoulders, MD  predniSONE  (DELTASONE ) 10 MG  tablet Take 3 tablets (30 mg total) by mouth daily with breakfast. Patient not taking: Reported on 03/08/2023 01/24/23   Christopher Savannah, PA-C    Allergies: Semaglutide and Tirzepatide    Review of Systems  Updated Vital Signs BP 106/73   Pulse 84   Temp 97.8 F (36.6 C) (Oral)   Resp 13   LMP  (LMP Unknown)   SpO2 100%   Physical Exam Vitals and nursing note reviewed.  Constitutional:      General: She is not in acute distress.    Appearance: She is well-developed.  HENT:     Head: Normocephalic and atraumatic.     Right Ear: External ear normal.     Left Ear: External ear normal.     Nose: Nose normal.  Eyes:      Extraocular Movements: Extraocular movements intact.     Conjunctiva/sclera: Conjunctivae normal.     Pupils: Pupils are equal, round, and reactive to light.  Cardiovascular:     Rate and Rhythm: Normal rate and regular rhythm.     Heart sounds: No murmur heard.    Comments: Chest pain reproducible on right side.  No rash noted.  Radial pulses 2+ bilaterally. Pulmonary:     Effort: Pulmonary effort is normal. No respiratory distress.     Breath sounds: Normal breath sounds.  Musculoskeletal:     Cervical back: Normal range of motion and neck supple.     Right lower leg: No edema.     Left lower leg: No edema.  Skin:    General: Skin is warm and dry.  Neurological:     Mental Status: She is alert and oriented to person, place, and time. Mental status is at baseline.  Psychiatric:        Mood and Affect: Mood normal.     (all labs ordered are listed, but only abnormal results are displayed) Labs Reviewed  CBC WITH DIFFERENTIAL/PLATELET  COMPREHENSIVE METABOLIC PANEL WITH GFR  TROPONIN T, HIGH SENSITIVITY    EKG: EKG Interpretation Date/Time:  Tuesday December 31 2023 06:47:37 EDT Ventricular Rate:  81 PR Interval:  187 QRS Duration:  93 QT Interval:  395 QTC Calculation: 459 R Axis:   13  Text Interpretation: Sinus rhythm Low voltage, precordial leads Borderline T abnormalities, anterior leads Confirmed by Griselda Norris [phone]) on 12/31/2023 6:51:20 AM  Radiology: No results found.  {Document cardiac monitor, telemetry assessment procedure when appropriate:32947} Procedures   Medications Ordered in the ED  aspirin chewable tablet 324 mg (has no administration in time range)      {Click here for ABCD2, HEART and other calculators REFRESH Note before signing:1}                              Medical Decision Making Risk OTC drugs.   ***  {Document critical care time when appropriate  Document review of labs and clinical decision tools ie CHADS2VASC2,  etc  Document your independent review of radiology images and any outside records  Document your discussion with family members, caretakers and with consultants  Document social determinants of health affecting pt's care  Document your decision making why or why not admission, treatments were needed:32947:::1}   Final diagnoses:  None    ED Discharge Orders     None

## 2023-12-31 NOTE — Discharge Instructions (Signed)
 You were seen for your chest pain in the emergency department. It is likely from a pulled muscle.   At home, please take tylenol  and ibuprofen for your pain. Use icy hot for your pain as well.    Check your MyChart online for the results of any tests that had not resulted by the time you left the emergency department.   Follow-up with your primary doctor in 2-3 days regarding your visit.    Return immediately to the emergency department if you experience any of the following: worsening pain, difficulty breathing, unexplained sweating, or any other concerning symptoms.    Thank you for visiting our Emergency Department. It was a pleasure taking care of you today.

## 2023-12-31 NOTE — ED Triage Notes (Signed)
 Right shoulder and chest pain since last night, with SOB

## 2024-01-15 DIAGNOSIS — R0789 Other chest pain: Secondary | ICD-10-CM | POA: Diagnosis not present

## 2024-01-15 DIAGNOSIS — S29012A Strain of muscle and tendon of back wall of thorax, initial encounter: Secondary | ICD-10-CM | POA: Diagnosis not present

## 2024-01-15 DIAGNOSIS — N644 Mastodynia: Secondary | ICD-10-CM | POA: Diagnosis not present

## 2024-01-21 DIAGNOSIS — E559 Vitamin D deficiency, unspecified: Secondary | ICD-10-CM | POA: Diagnosis not present

## 2024-01-21 DIAGNOSIS — E039 Hypothyroidism, unspecified: Secondary | ICD-10-CM | POA: Diagnosis not present

## 2024-01-21 DIAGNOSIS — E210 Primary hyperparathyroidism: Secondary | ICD-10-CM | POA: Diagnosis not present

## 2024-01-28 DIAGNOSIS — N644 Mastodynia: Secondary | ICD-10-CM | POA: Diagnosis not present

## 2024-01-28 DIAGNOSIS — I10 Essential (primary) hypertension: Secondary | ICD-10-CM | POA: Diagnosis not present

## 2024-01-28 DIAGNOSIS — E039 Hypothyroidism, unspecified: Secondary | ICD-10-CM | POA: Diagnosis not present

## 2024-01-28 DIAGNOSIS — E210 Primary hyperparathyroidism: Secondary | ICD-10-CM | POA: Diagnosis not present

## 2024-01-28 DIAGNOSIS — E559 Vitamin D deficiency, unspecified: Secondary | ICD-10-CM | POA: Diagnosis not present

## 2024-02-05 DIAGNOSIS — L574 Cutis laxa senilis: Secondary | ICD-10-CM | POA: Diagnosis not present

## 2024-02-05 DIAGNOSIS — D489 Neoplasm of uncertain behavior, unspecified: Secondary | ICD-10-CM | POA: Diagnosis not present

## 2024-02-05 DIAGNOSIS — D229 Melanocytic nevi, unspecified: Secondary | ICD-10-CM | POA: Diagnosis not present

## 2024-02-05 DIAGNOSIS — L738 Other specified follicular disorders: Secondary | ICD-10-CM | POA: Diagnosis not present

## 2024-02-05 DIAGNOSIS — L922 Granuloma faciale [eosinophilic granuloma of skin]: Secondary | ICD-10-CM | POA: Diagnosis not present

## 2024-02-05 DIAGNOSIS — L814 Other melanin hyperpigmentation: Secondary | ICD-10-CM | POA: Diagnosis not present

## 2024-02-05 DIAGNOSIS — L928 Other granulomatous disorders of the skin and subcutaneous tissue: Secondary | ICD-10-CM | POA: Diagnosis not present

## 2024-02-19 DIAGNOSIS — M7701 Medial epicondylitis, right elbow: Secondary | ICD-10-CM | POA: Diagnosis not present

## 2024-02-25 DIAGNOSIS — M7701 Medial epicondylitis, right elbow: Secondary | ICD-10-CM | POA: Diagnosis not present

## 2024-03-20 ENCOUNTER — Institutional Professional Consult (permissible substitution): Admitting: Plastic Surgery

## 2024-06-26 ENCOUNTER — Ambulatory Visit: Admitting: Behavioral Health
